# Patient Record
Sex: Female | Born: 1970 | Race: Black or African American | Hispanic: No | Smoking: Never smoker
Health system: Southern US, Community
[De-identification: ages and names within clinical notes are randomized; demographics above are authoritative.]

## PROBLEM LIST (undated history)

## (undated) DIAGNOSIS — Z789 Other specified health status: Secondary | ICD-10-CM

## (undated) DIAGNOSIS — R87629 Unspecified abnormal cytological findings in specimens from vagina: Secondary | ICD-10-CM

## (undated) HISTORY — PX: GALLBLADDER SURGERY: SHX652

## (undated) HISTORY — DX: Other specified health status: Z78.9

## (undated) HISTORY — DX: Unspecified abnormal cytological findings in specimens from vagina: R87.629

---

## 2001-05-31 ENCOUNTER — Inpatient Hospital Stay (HOSPITAL_COMMUNITY): Admission: AD | Admit: 2001-05-31 | Discharge: 2001-06-02 | Payer: Self-pay | Admitting: Obstetrics and Gynecology

## 2004-05-01 ENCOUNTER — Emergency Department (HOSPITAL_COMMUNITY): Admission: EM | Admit: 2004-05-01 | Discharge: 2004-05-01 | Payer: Self-pay | Admitting: Emergency Medicine

## 2004-09-20 ENCOUNTER — Observation Stay (HOSPITAL_COMMUNITY): Admission: RE | Admit: 2004-09-20 | Discharge: 2004-09-21 | Payer: Self-pay | Admitting: Obstetrics and Gynecology

## 2004-11-05 ENCOUNTER — Inpatient Hospital Stay (HOSPITAL_COMMUNITY): Admission: RE | Admit: 2004-11-05 | Discharge: 2004-11-07 | Payer: Self-pay | Admitting: Obstetrics and Gynecology

## 2005-05-19 IMAGING — US US OB COMP +14 WK
1 series · 13 of 28 positions shown · non-contrast
Comparison: none

CLINICAL DATA: No prenatal care; advanced gestational age

[Series 1: unknown · 13 of 41 slices shown]
[im 2/41]
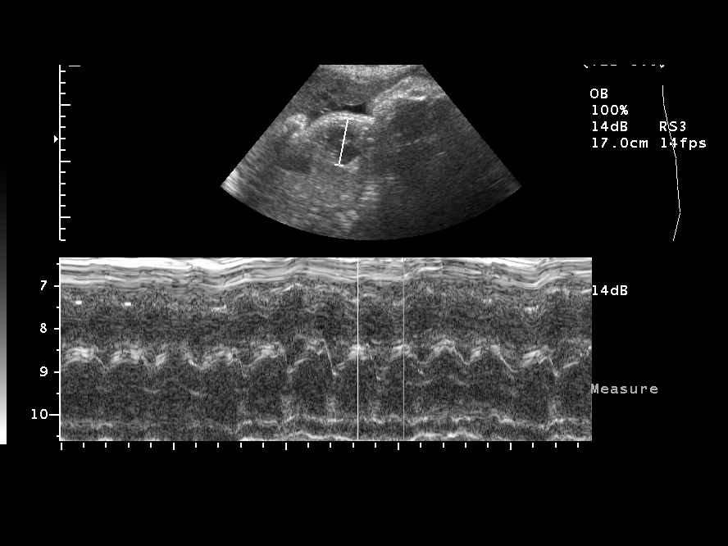
[im 5/41]
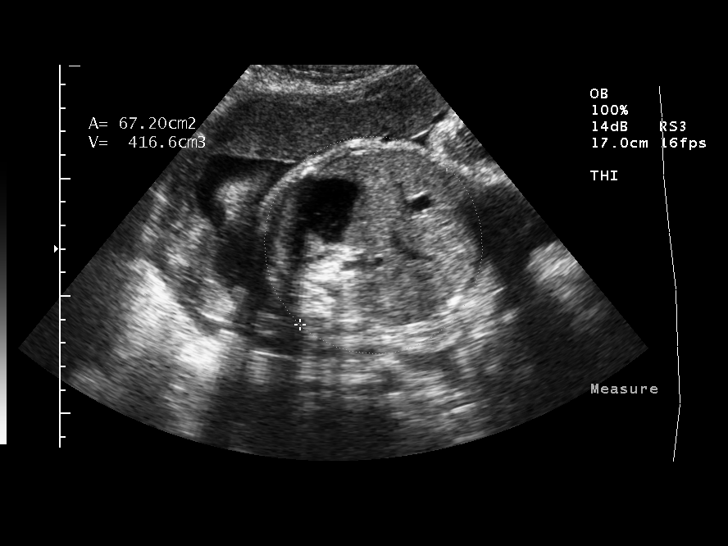
[im 8/41]
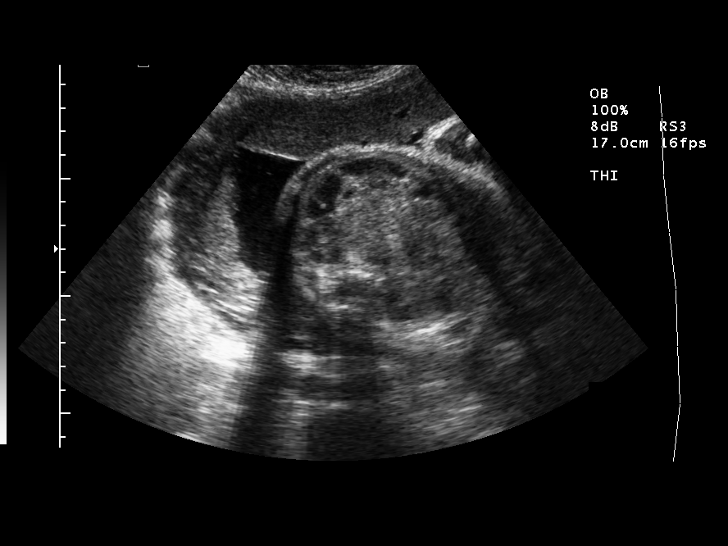
[im 11/41]
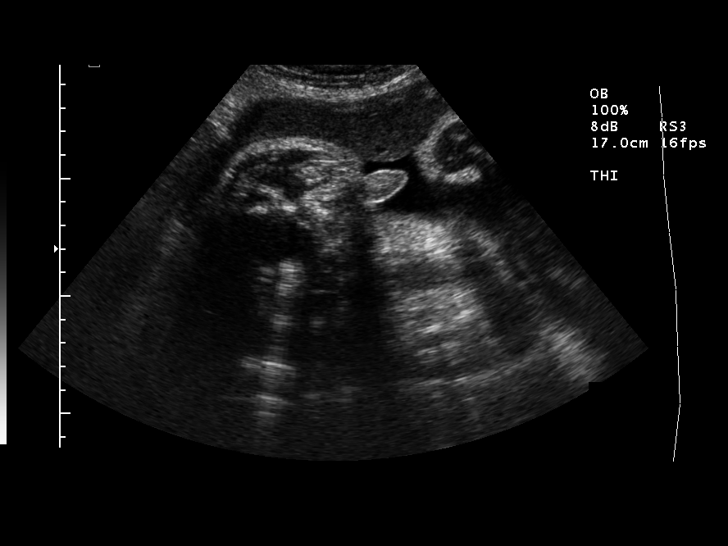
[im 14/41]
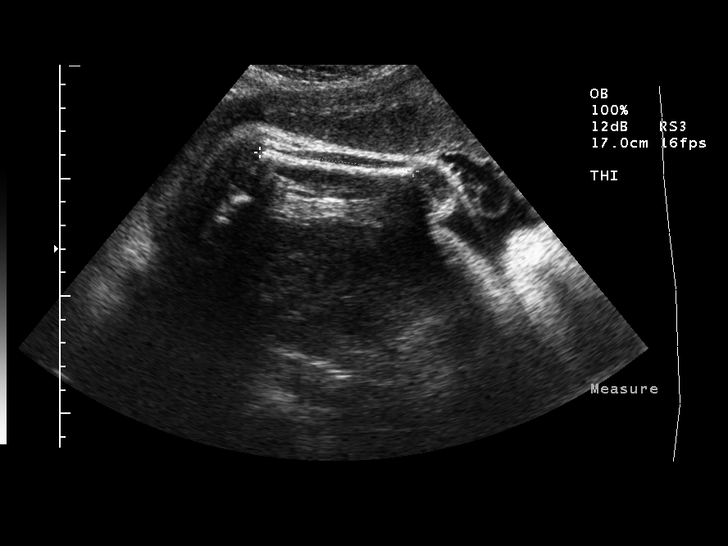
[im 17/41]
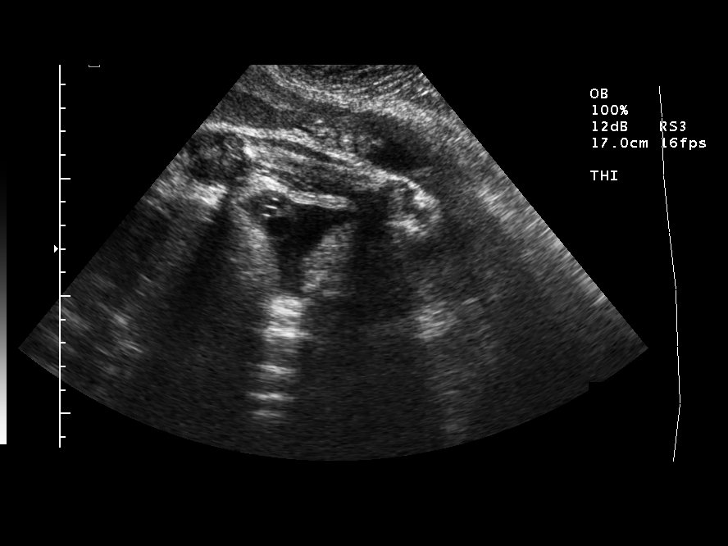
[im 21/41]
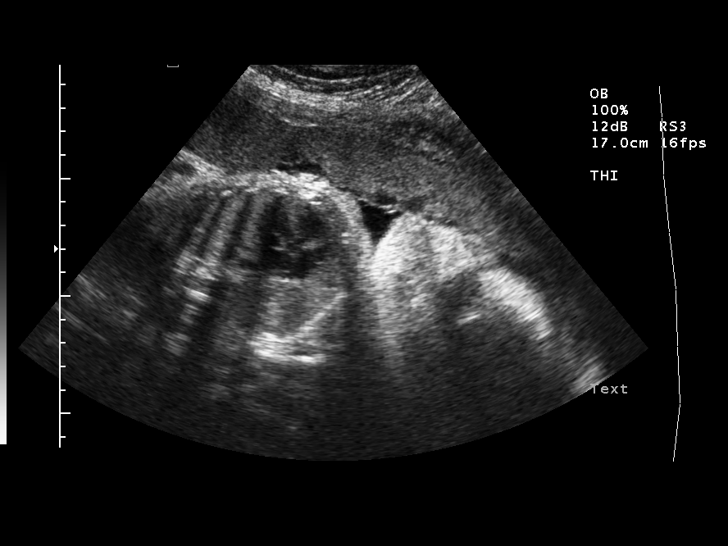
[im 24/41]
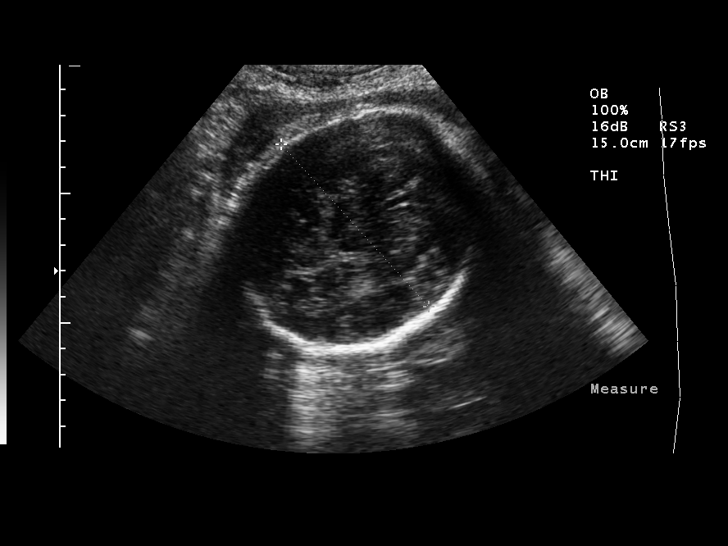
[im 27/41]
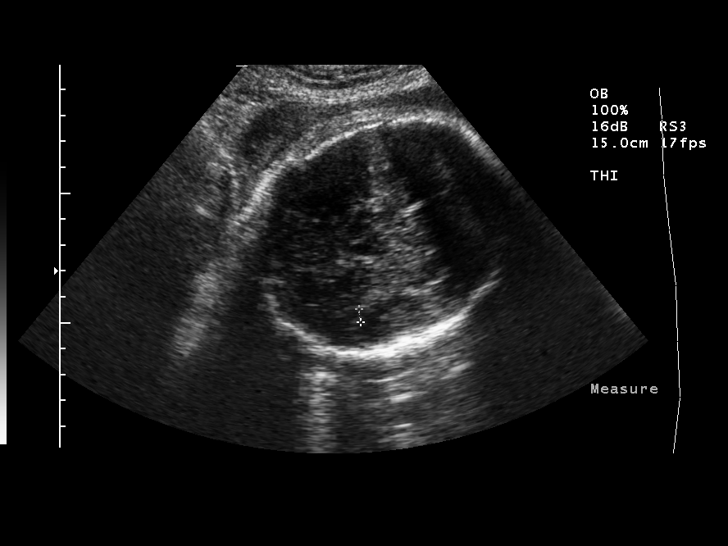
[im 30/41]
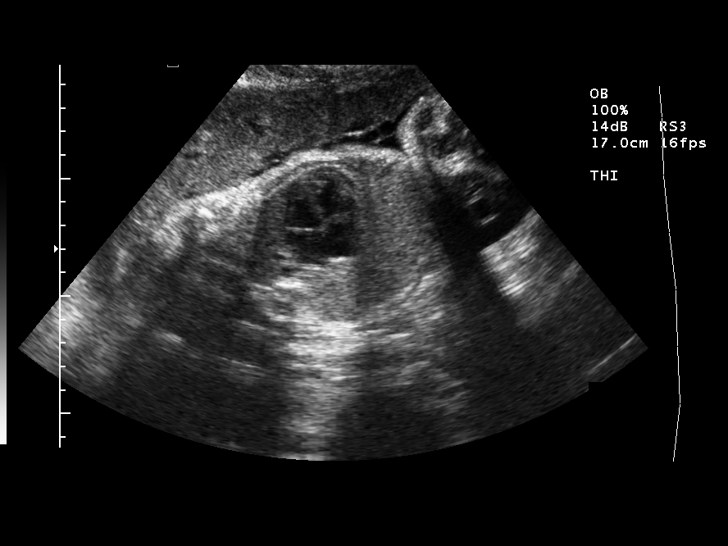
[im 33/41]
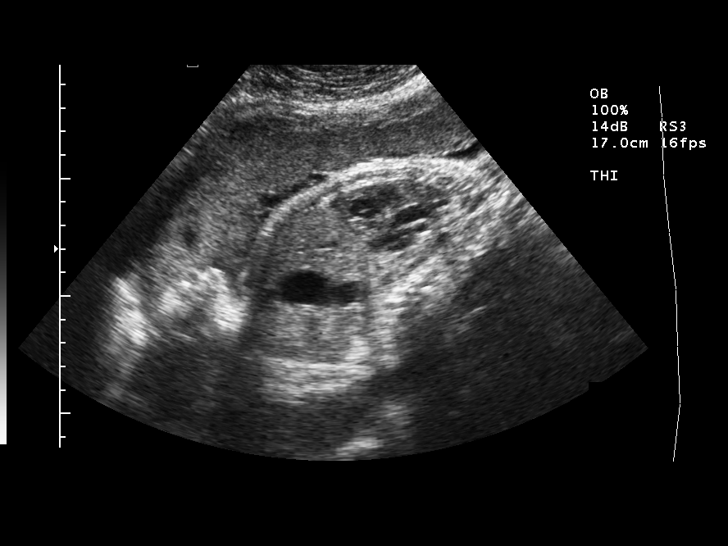
[im 36/41]
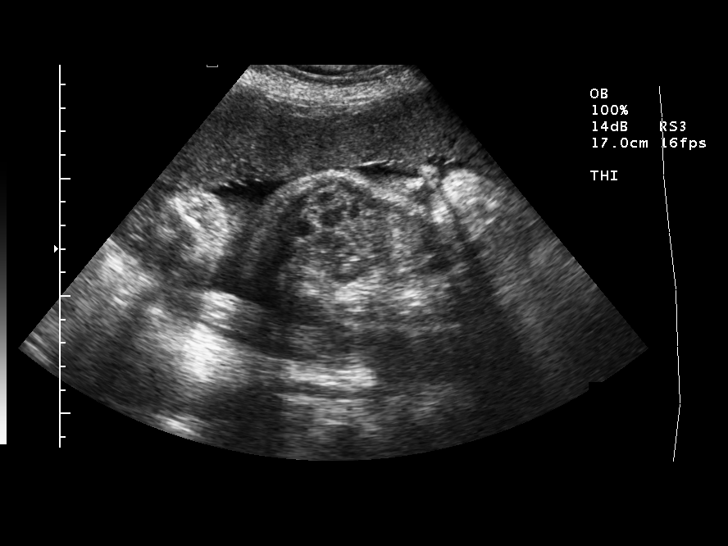
[im 39/41]
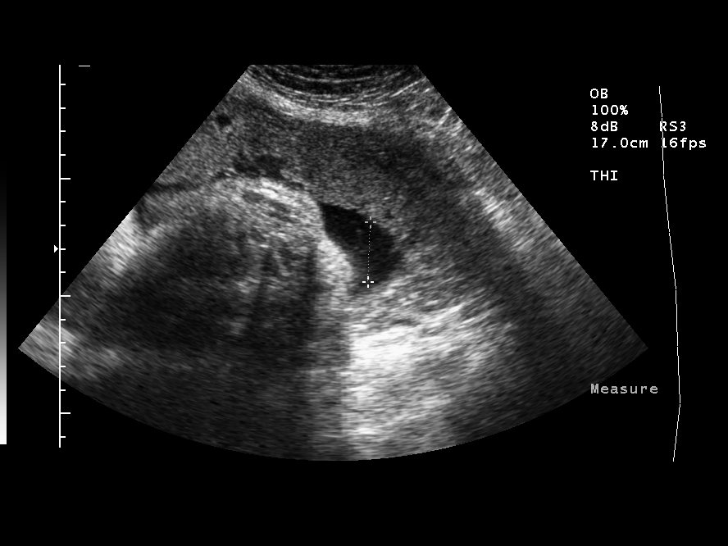

[13 of 28 positions shown; findings below may reference images not displayed]

OBSTETRICAL ULTRASOUND:
 Number of Fetuses:  Single
 Heart Rate:  147 beats per minute 
 Movement:  Yes
 Breathing:  Yes  
 Presentation:  Cephalic
 Placental Location:  Anterior
 Previa:  No
 Amniotic Fluid (Subjective):  Normal
 Amniotic Fluid (Objective):   11.8 cm (AFI) for 34 weeks

 FETAL BIOMETRY
 BPD:   8.4 cm, 34 weeks 0 days
 HC:   30.4 cm, 33 weeks 6 days
 AC:  29.1 cm, 33 weeks 1 day
 FL:  6.5 cm, 34 weeks 0 days

 MEAN GA:  33 weeks 5 days

 EFW:  3343 grams 

 FETAL ANATOMY
 Lateral Ventricles:  Visualized 
 Thalami/CSP:    Visualized 
 Posterior Fossa:  Not visualized 
 Nuchal Region:  N/A
 Spine:    Not visualized   
 4 Chamber Heart on Left:    Visualized 
 Stomach on Left:    Visualized 
 3 Vessel Cord:  Visualized 
 Cord Insertion site:  Not visualized 
 Kidneys:  Visualized 
 Bladder:  Visualized 
 Extremities:    Left and right lower

 ADDITIONAL ANATOMY VISUALIZED:  Male genitalia

 Evaluation limited by:  Fetal position and advanced gestational age. 

 MATERNAL UTERINE AND ADNEXAL FINDINGS
 Cervix: 3.1 cm
IMPRESSION: 1.  Single live intrauterine gestation in cephalic presentation.  Anterior placenta without previa.
 2.  No morphologic abnormalities, though evaluation of morphology is limited by advanced gestational age.

 </u12:p>

## 2010-03-23 ENCOUNTER — Emergency Department (HOSPITAL_COMMUNITY): Admission: EM | Admit: 2010-03-23 | Discharge: 2010-03-23 | Payer: Self-pay | Admitting: Emergency Medicine

## 2010-09-28 ENCOUNTER — Emergency Department (HOSPITAL_COMMUNITY)
Admission: EM | Admit: 2010-09-28 | Discharge: 2010-09-28 | Payer: Self-pay | Source: Home / Self Care | Admitting: Emergency Medicine

## 2010-11-21 LAB — URINALYSIS, ROUTINE W REFLEX MICROSCOPIC
Glucose, UA: NEGATIVE mg/dL
Ketones, ur: NEGATIVE mg/dL
Protein, ur: NEGATIVE mg/dL

## 2010-11-21 LAB — URINE CULTURE
Colony Count: NO GROWTH
Culture: NO GROWTH

## 2010-11-21 LAB — URINE MICROSCOPIC-ADD ON

## 2011-01-22 NOTE — Discharge Summary (Signed)
[facility]  Patient:    Lauren Mata, Lauren Mata Visit Number: [id number] MRN: [id number]          Service Type: Attending:  Christin Bach, M.D. Dictated by:   Christin Bach, M.D. Adm. Date:  05/31/01 Disc. Date: 06/02/01                             Discharge Summary  ADMITTING DIAGNOSES:  Pregnancy term, active labor, unregistered pregnancy without prenatal care.  DISCHARGE DIAGNOSES:  Pregnancy term, delivered, unregistered, no prenatal care.  PROCEDURE:  Obstetrical ultrasound by Christin Bach, M.D. May 31, 2001, vacuum assisted vaginal delivery 72.79 by Zerita Boers, C.N.M.  DISCHARGE MEDICATIONS: 1. Darvocet-N 100 one q.4h. p.r.n. pain. 2. Motrin 800 mg one q.8h. p.r.n. minor discomfort.  FOLLOW-UP:  Four weeks our office for postpartum tubal ligation.  HISTORY OF PRESENT ILLNESS:  This 40 year old female gravida 5, para 4 is admitted after pregnancy for which she sought prenatal care.  She presented on the evening of May 31, 2001 complaining of contractions that p.m. Prenatal course was completely unknown to physicians.  She knew she was pregnant and chose not to seek care.  She did not take iron and vitamins.  She presented on the evening with active labor.  Initial assessment showed the cervix to be 6 cm, 100%, -1 with vertex presentation suspected.  Group B Strep culture was performed.  Ultrasound performed revealing the infant to be a term sized infant, vertex presentation with biparietal diameter 93 mm.  No evidence of oligohydramnios.  Amniotomy was performed.  The patient then progressed over the next hour to completely dilated, delivered by vacuum assisted vaginal delivery due to fetal bradycardia.  Infant was an 8 pound 0.6 ounce female infant with Apgars 9/9.  Amniotic fluid was clear without malodor.  She did receive a single dose of antibiotics, ampicillin 2 g IV due to unknown group B Strep status.  Postpartum course was  uneventful.  The patient was stable for discharge home on June 02, 2001.  FOLLOW-UP:  Four weeks for tubal ligation. Dictated by:   Christin Bach, M.D. Attending:  Christin Bach, M.D. DD:  06/11/01 TD:  06/12/01 Job: [id number]

## 2011-01-22 NOTE — H&P (Signed)
NAMEMARYJAYNE, KLEVEN              ACCOUNT NO.:  [id number]   MEDICAL RECORD NO.:  [id number]          PATIENT TYPE:  INP   LOCATION:  LDR5                          FACILITY:  [facility]   PHYSICIAN:  Tilda Burrow, M.D. DATE OF BIRTH:  1971-05-26   DATE OF ADMISSION:  11/05/2004  DATE OF DISCHARGE:  LH                                HISTORY & PHYSICAL   CHIEF COMPLAINT:  Labor.   HISTORY OF PRESENT ILLNESS:  Welma is a 40 year old gravida 8, para 5 with  an EDC of March 1 based on a 33-week ultrasound. She historically has very  little prenatal care, and in fact, did not have any prenatal care for this  baby except for a visit to labor and delivery back in January for some  preterm contractions without cervical change. At that time, we did draw some  prenatal labs on her and did an ultrasound. Her fundal height is  appropriate. Looking through her record at the office, there are records of  six spontaneous vaginal deliveries, although in the emergency room she said  she only had four children, and then today she says she only had five, so I  am not sure where this is going, but we do have a record of six vaginal  deliveries. So this will be her seventh delivery.   PRENATAL LABORATORY DATA:  Blood type A positive, rubella is immune. HBSAG,  HIV, and RPR are all negative.   PAST MEDICAL HISTORY:  Negative.   PAST SURGICAL HISTORY:  Positive for gallbladder surgery.   OBSTETRICAL HISTORY:  Little prenatal care as noted before, but the  deliveries were uncomplicated. Prenatal course has been complicated by no  prenatal care.   PHYSICAL EXAMINATION:  HEENT:  Within normal limits.  LUNGS:  Clear to auscultation.  HEART:  Regular rate and rhythm.  ABDOMEN:  Soft and nontender. Fundal height is appropriate for term, having  mild to moderate contractions every five to six minutes. Cervix is 5, 100%,  -2 station. Artificial rupture of membranes reveals clear fluid. Fetal heart  rate is  reactive without decelerations.  EXTREMITIES:  Legs are negative.   IMPRESSION:  Intrauterine pregnancy at 40 weeks based on a mid third  trimester ultrasound, active labor, reassuring maternal/fetal status.   PLAN:  We will have expectant management except for prophylaxis treatment of  group B strep since that is unknown.      FC/MEDQ  D:  11/05/2004  T:  11/05/2004  Job:  [id number]

## 2011-01-22 NOTE — Discharge Summary (Signed)
Lauren Mata, HESLER              ACCOUNT NO.:  [id number]   MEDICAL RECORD NO.:  [id number]          PATIENT TYPE:  OIB   LOCATION:  A415                          FACILITY:  [facility]   PHYSICIAN:  Lazaro Arms, M.D.   DATE OF BIRTH:  02-Oct-1970   DATE OF ADMISSION:  09/20/2004  DATE OF DISCHARGE:  01/15/2006LH                                 DISCHARGE SUMMARY   REASON FOR ADMISSION:  Premature uterine contractions.   PAST MEDICAL HISTORY:  Negative.   PAST SURGICAL HISTORY:  Negative.   ALLERGIES:  No known drug allergies.   PRENATAL COURSE:  The patient has had no prenatal care uneventful, per  patient, up until this time.  She presents now with a 31 cm fundal height  and premature uterine contractions.   PHYSICAL EXAMINATION:  VITAL SIGNS:  Normal.  Contractions have not arrested  on p.o. Brethine.  ABDOMEN:  Fundal height is 31 cm.  Fetal heart tones strong and regular  without decelerations.   PLAN:  We are going to get an OB ultrasound, OB prenatal profile and  discharge the patient on p.o. Brethine 5 mg q.4h.  She is to be seen in the  office on Wednesday.      DL/MEDQ  D:  04/54/0981  T:  09/21/2004  Job:  [id number]   cc:   [facility]

## 2011-01-22 NOTE — Op Note (Signed)
Lauren Mata, Lauren Mata              ACCOUNT NO.:  [id number]   MEDICAL RECORD NO.:  [id number]          PATIENT TYPE:  INP   LOCATION:  LDR5                          FACILITY:  [facility]   PHYSICIAN:  Lazaro Arms, M.D.   DATE OF BIRTH:  07/02/1971   DATE OF PROCEDURE:  11/05/2004  DATE OF DISCHARGE:                                 OPERATIVE REPORT   DELIVERY NOTE:  Ozzie progressed quickly through labor and was noted to be  10 cm dilated at approximately 11:15.  After a brief second stage, she  delivered a viable female infant at 11:27.  Apgars were 9 and 9, weight 7  pounds 5 ounces.  He does appear to be a full-term baby.  Twenty units of  Pitocin dilated in 1000 mL of lactated Ringer's was infused rapidly IV.  The  placenta separated spontaneously and delivered via controlled cord traction  at 11:37.  It was inspected and appears to be intact with a three-vessel  cord.  Blood loss is minimal.  The vagina was inspected and no lacerations  were noted.  The patient tolerated delivery well.  She does express an  interest to get her tubes tied.  Due to no prenatal care, will have to get  the Medicaid permission slip form signed in the office, and she plans to be  abstinent for the next four weeks.      FC/MEDQ  D:  11/05/2004  T:  11/05/2004  Job:  [id number]

## 2011-05-26 IMAGING — CR DG CHEST 2V
2 series · 2 of 2 positions shown · non-contrast
Comparison: None

CLINICAL DATA: Cough.  Chest congestion.  Short of breath.

CHEST - 2 VIEW

[view not recorded (1 of 2)]
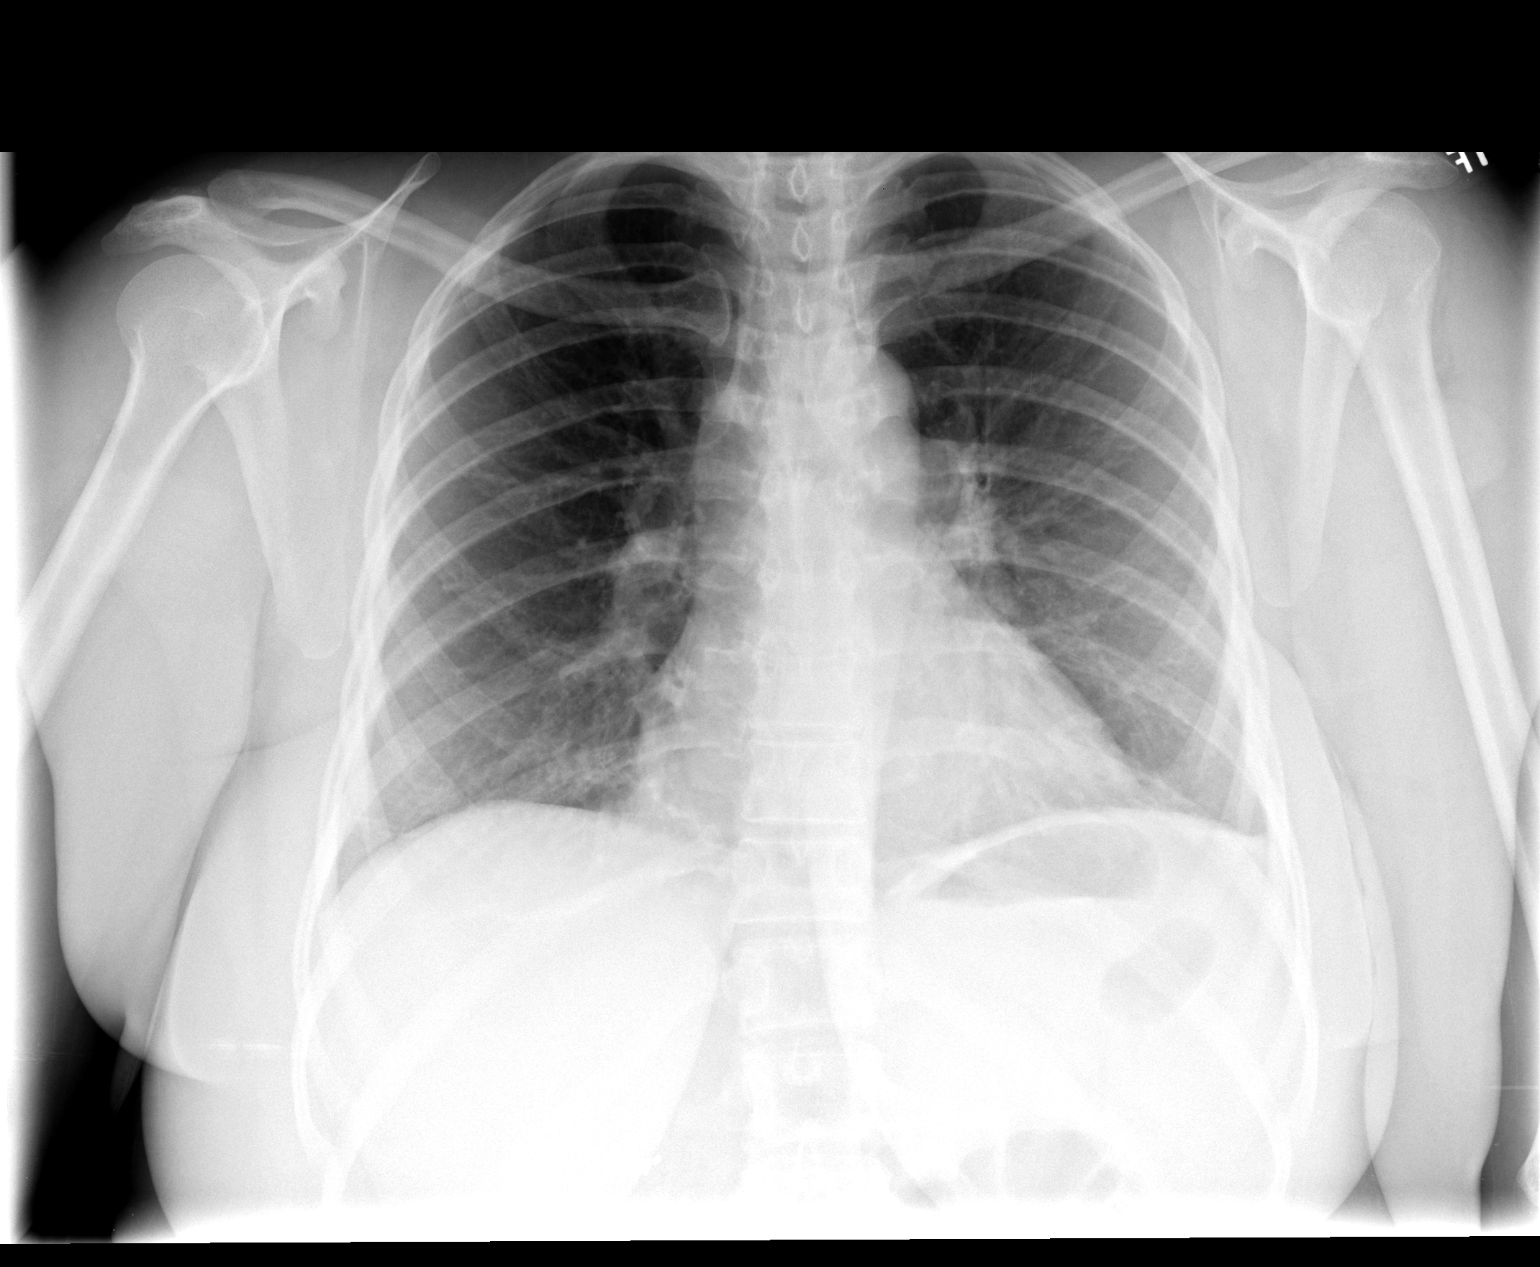

[view not recorded (2 of 2)]
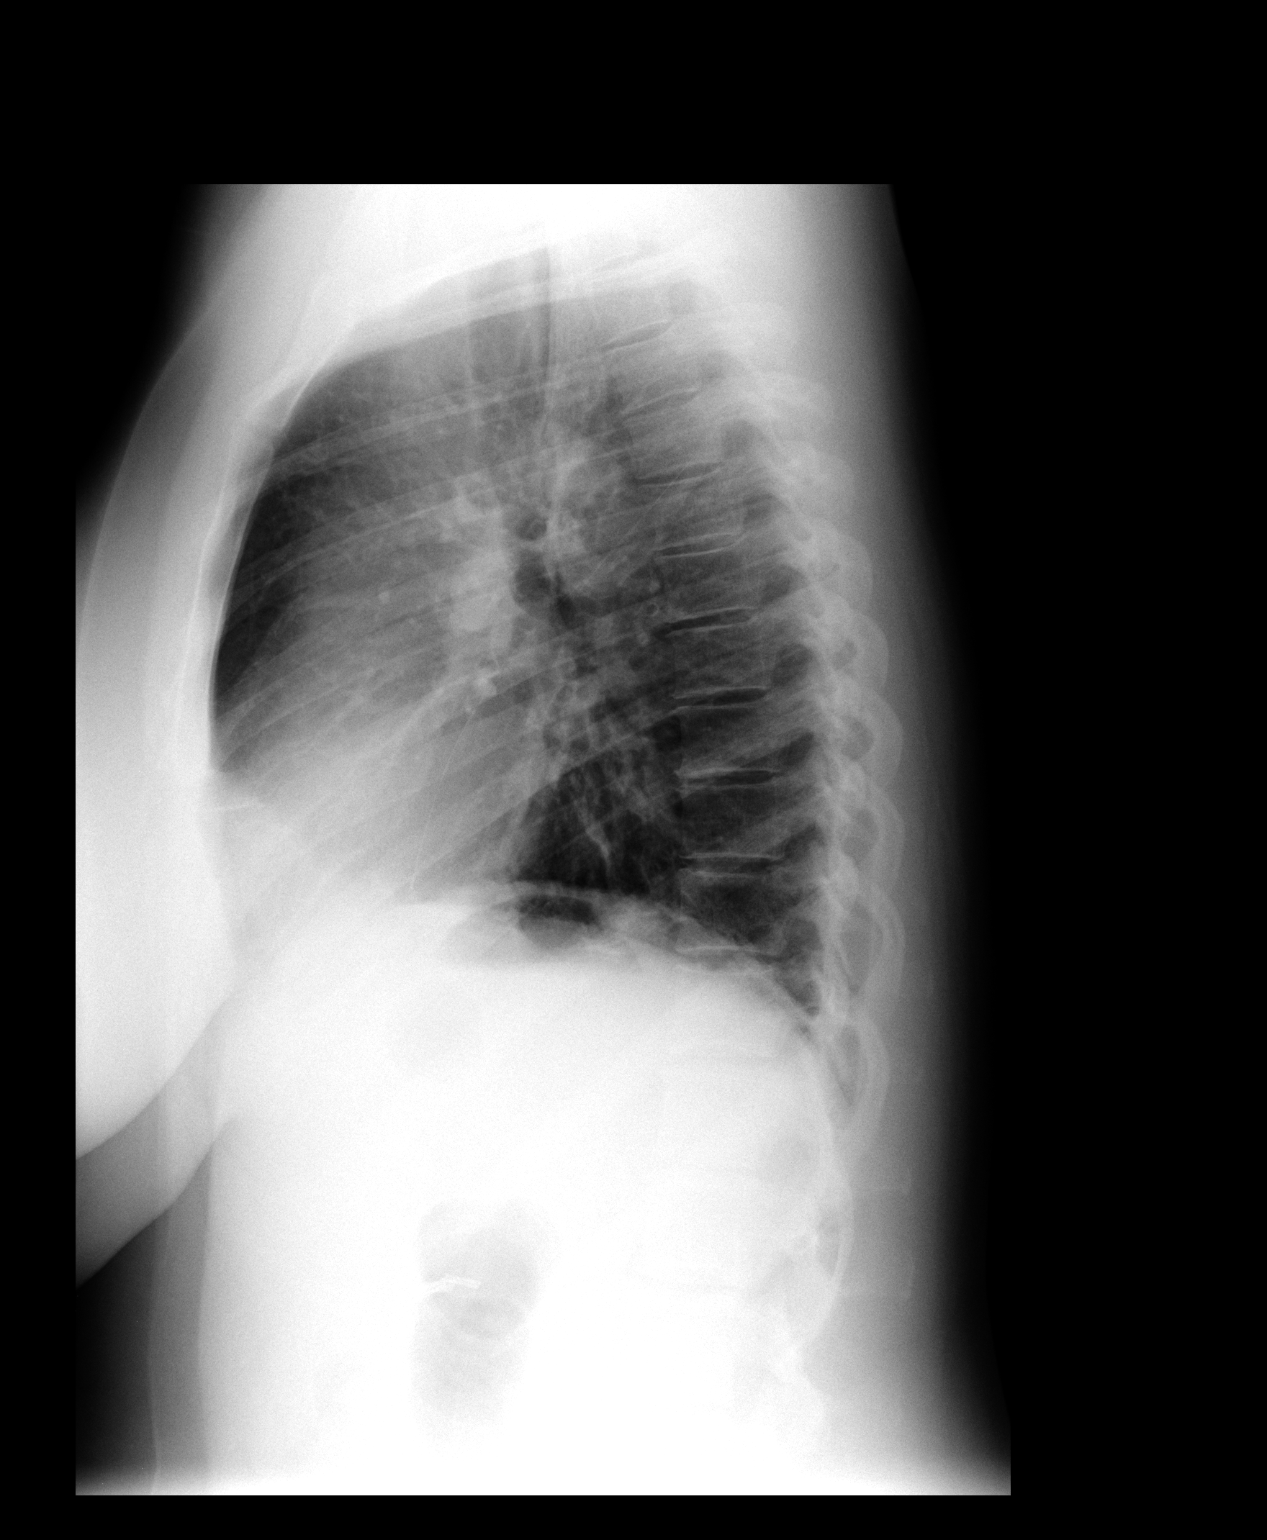

[2 of 2 positions shown; findings below may reference images not displayed]

FINDINGS: Heart size is normal.  Mediastinal shadows are normal.
There is mild bronchial thickening but there is no infiltrate,
collapse or effusion.  No bony abnormality.
IMPRESSION: Bronchitis.  No consolidation or collapse.

## 2013-01-18 ENCOUNTER — Encounter: Payer: Self-pay | Admitting: *Deleted

## 2013-01-26 ENCOUNTER — Encounter: Payer: Self-pay | Admitting: *Deleted

## 2013-01-30 ENCOUNTER — Encounter: Payer: Self-pay | Admitting: *Deleted

## 2013-01-30 ENCOUNTER — Ambulatory Visit: Payer: Self-pay | Admitting: Obstetrics & Gynecology

## 2013-05-17 ENCOUNTER — Encounter: Payer: Medicaid Other | Admitting: Obstetrics & Gynecology

## 2013-05-31 ENCOUNTER — Encounter: Payer: Self-pay | Admitting: Obstetrics & Gynecology

## 2013-05-31 ENCOUNTER — Ambulatory Visit (INDEPENDENT_AMBULATORY_CARE_PROVIDER_SITE_OTHER): Payer: Medicaid Other | Admitting: Obstetrics & Gynecology

## 2013-05-31 VITALS — BP 100/80 | Ht 60.0 in | Wt 180.0 lb

## 2013-05-31 DIAGNOSIS — B081 Molluscum contagiosum: Secondary | ICD-10-CM

## 2013-05-31 NOTE — Progress Notes (Signed)
Patient ID: Lauren Mata, female   DOB: [birth year], 42 y.o.   MRN: [id number] [name] is it she symptomatic external vulvar lesion for the past 6 months or so Ties discharge He today she's been fully worked up for STDs all of which was negative  On exam she has molluscum contagiosum Vagina is no discharge no lesions her IUD is visible  I recommend she used topical T. tree oil I'll see her back in one month for followup that's not successful there some other products we can use

## 2013-06-28 ENCOUNTER — Ambulatory Visit: Payer: Medicaid Other | Admitting: Obstetrics & Gynecology

## 2014-07-08 ENCOUNTER — Encounter: Payer: Self-pay | Admitting: Obstetrics & Gynecology

## 2014-12-17 ENCOUNTER — Ambulatory Visit (INDEPENDENT_AMBULATORY_CARE_PROVIDER_SITE_OTHER): Payer: Medicaid Other | Admitting: Advanced Practice Midwife

## 2014-12-17 ENCOUNTER — Encounter: Payer: Self-pay | Admitting: Advanced Practice Midwife

## 2014-12-17 VITALS — BP 110/70 | HR 80 | Ht 60.0 in | Wt 188.6 lb

## 2014-12-17 DIAGNOSIS — Z30433 Encounter for removal and reinsertion of intrauterine contraceptive device: Secondary | ICD-10-CM

## 2014-12-17 DIAGNOSIS — Z3202 Encounter for pregnancy test, result negative: Secondary | ICD-10-CM

## 2014-12-17 LAB — POCT URINE PREGNANCY: Preg Test, Ur: NEGATIVE

## 2014-12-17 NOTE — Progress Notes (Signed)
Lauren Mata is a 44 y.o. year old African American female who presents for removal and replacement of her IUD. The new IUD is Lyletta. It has been 5 years since her previous IUD placement.  The risks and benefits of the method and placement have been thouroughly reviewed with the patient and all questions were answered.  Specifically the patient is aware of failure rate of 09/998, expulsion of the IUD and of possible perforation.  The patient is aware of irregular bleeding due to the method and understands the incidence of irregular bleeding diminishes with time.  Time out was performed.  A Seigler speculum was placed.  The cervix was prepped using Betadine. The strings were found to be  visible.   They were grasped and the Mirena was easily removed. The cervix was then grasped with a tenaculum and the uterus was sounded to 7 cm. The IUD was inserted to 7 cm.  It was pulled back 1 cm and the IUD was disengaged. After 15 seconds, the IUD was placed in the fundus.  The strings were trimmed to 3 cm.  Sonogram was performed and the proper placement of the IUD was verified.  The patient was instructed on signs and symptoms of infection and to check for the strings after each menses or each month.  The patient is to refrain from intercourse for 3 days.

## 2015-01-14 ENCOUNTER — Encounter: Payer: Self-pay | Admitting: *Deleted

## 2015-01-14 ENCOUNTER — Ambulatory Visit: Payer: Medicaid Other | Admitting: Advanced Practice Midwife

## 2017-03-05 ENCOUNTER — Emergency Department
Admission: EM | Admit: 2017-03-05 | Discharge: 2017-03-05 | Disposition: A | Discharge status: Home / Self Care | Payer: Self-pay | Attending: Emergency Medicine | Admitting: Emergency Medicine

## 2017-03-05 DIAGNOSIS — Y999 Unspecified external cause status: Secondary | ICD-10-CM | POA: Insufficient documentation

## 2017-03-05 DIAGNOSIS — S0101XA Laceration without foreign body of scalp, initial encounter: Secondary | ICD-10-CM | POA: Insufficient documentation

## 2017-03-05 DIAGNOSIS — Y29XXXA Contact with blunt object, undetermined intent, initial encounter: Secondary | ICD-10-CM | POA: Insufficient documentation

## 2017-03-05 DIAGNOSIS — Y929 Unspecified place or not applicable: Secondary | ICD-10-CM | POA: Insufficient documentation

## 2017-03-05 DIAGNOSIS — Y939 Activity, unspecified: Secondary | ICD-10-CM | POA: Insufficient documentation

## 2017-03-05 DIAGNOSIS — Z23 Encounter for immunization: Secondary | ICD-10-CM | POA: Insufficient documentation

## 2017-03-05 MED ORDER — TETANUS-DIPHTH-ACELL PERTUSSIS 5-2.5-18.5 LF-MCG/0.5 IM SUSP
0.5000 mL | Freq: Once | INTRAMUSCULAR | Status: AC
  Administered 2017-03-05: 0.5 mL via INTRAMUSCULAR
  Filled 2017-03-05: qty 0.5

## 2017-03-05 MED ORDER — LIDOCAINE HCL (PF) 1 % IJ SOLN
10.0000 mL | Freq: Once | INTRAMUSCULAR | Status: AC
  Administered 2017-03-05: 10 mL
  Filled 2017-03-05: qty 10

## 2017-03-05 MED ORDER — AMOXICILLIN-POT CLAVULANATE 875-125 MG PO TABS: 1.0000 | ORAL_TABLET | Freq: Two times a day (BID) | ORAL | 0 refills | Status: DC

## 2017-03-05 NOTE — ED Provider Notes (Signed)
[facility] Emergency Department Provider Note  ____________________________________________  Time seen: Approximately 8:49 PM  I have reviewed the triage vital signs and the nursing notes.   HISTORY  Chief Complaint Head Laceration    HPI Lauren Mata is a 46 y.o. female who presents emergency department complaining of a laceration to her scalp. Patient states that she leaned over and hit her head against the corner of A countertop. Patient was able control bleeding with direct pressure. Patient denies any loss of consciousness. No headache. No other injury or complaint. No medications prior to arrival. Patient is unsure of her last tetanus shot.   Past Medical History:  Diagnosis Date  . Medical history non-contributory     Patient Active Problem List   Diagnosis Date Noted  . Encounter for IUD removal and reinsertion 12/17/2014  . Molluscum contagiosum 05/31/2013    Past Surgical History:  Procedure Laterality Date  . GALLBLADDER SURGERY      Prior to Admission medications   Medication Sig Start Date End Date Taking? Authorizing Provider  amoxicillin-clavulanate (AUGMENTIN) 875-125 MG tablet Take 1 tablet by mouth 2 (two) times daily. 03/05/17   Amy Belloso, Delorise Royals, PA-C  levonorgestrel (MIRENA) 20 MCG/24HR IUD 1 each by Intrauterine route once.    [provider]    Allergies Patient has no known allergies.  No family history on file.  Social History Social History  Substance Use Topics  . Smoking status: Never Smoker  . Smokeless tobacco: Never Used  . Alcohol use No     Review of Systems  Constitutional: No fever/chills Eyes: No visual changes. No discharge ENT: No upper respiratory complaints. Cardiovascular: no chest pain. Respiratory: no cough. No SOB. Gastrointestinal: No abdominal pain.  No nausea, no vomiting.  Musculoskeletal: Negative for musculoskeletal pain. Skin: Negative for rash, abrasions,  lacerations, ecchymosis.Positive for scalp laceration. Neurological: Negative for headaches, focal weakness or numbness. 10-point ROS otherwise negative.  ____________________________________________   PHYSICAL EXAM:  VITAL SIGNS: ED Triage Vitals [03/05/17 2002]  Enc Vitals Group     BP 129/80     Pulse Rate 93     Resp 16     Temp 98.6 F (37 C)     Temp Source Oral     SpO2 100 %     Weight 180 lb (81.6 kg)     Height 5' (1.524 m)     Head Circumference      Peak Flow      Pain Score 3     Pain Loc      Pain Edu?      Excl. in GC?      Constitutional: Alert and oriented. Well appearing and in no acute distress. Eyes: Conjunctivae are normal. PERRL. EOMI. Head: 5 cm laceration noted to the right frontal scalp. This is just superior to the hairline. No bleeding at this time. No foreign body. Edges are smooth the nature. Edges are gaped open. Patient is nontender to palpation of the osseous structures of the skull or face. No battle signs. No raccoon eyes. No serosanguineous fluid drainage from the ears or nares. ENT:      Ears:       Nose: No congestion/rhinnorhea.      Mouth/Throat: Mucous membranes are moist.  Neck: No stridor.  No cervical spine tenderness to palpation.  Cardiovascular: Normal rate, regular rhythm. Normal S1 and S2.  Good peripheral circulation. Respiratory: Normal respiratory effort without tachypnea or retractions. Lungs CTAB. Good air  entry to the bases with no decreased or absent breath sounds. Musculoskeletal: Full range of motion to all extremities. No gross deformities appreciated. Neurologic:  Normal speech and language. No gross focal neurologic deficits are appreciated.  Skin:  Skin is warm, dry and intact. No rash noted. Psychiatric: Mood and affect are normal. Speech and behavior are normal. Patient exhibits appropriate insight and judgement.   ____________________________________________   LABS (all labs ordered are listed, but only  abnormal results are displayed)  Labs Reviewed - No data to display ____________________________________________  EKG   ____________________________________________  RADIOLOGY   No results found.  ____________________________________________    PROCEDURES  Procedure(s) performed:    Marland Kitchen.Marland Kitchen.Laceration Repair Date/Time: 03/05/2017 9:31 PM Performed by: Gala RomneyUTHRIELL, Zaim Nitta D Authorized by: Gala RomneyUTHRIELL, Isabeau Mccalla D   Consent:    Consent obtained:  Verbal   Consent given by:  Patient   Risks discussed:  Pain and poor cosmetic result Anesthesia (see MAR for exact dosages):    Anesthesia method:  Local infiltration   Local anesthetic:  Lidocaine 1% w/o epi Laceration details:    Location:  Scalp   Scalp location:  Frontal   Length (cm):  5 Repair type:    Repair type:  Simple Exploration:    Hemostasis achieved with:  Direct pressure   Wound exploration: wound explored through full range of motion and entire depth of wound probed and visualized     Wound extent: no foreign bodies/material noted     Contaminated: no   Treatment:    Area cleansed with:  Betadine   Amount of cleaning:  Extensive   Irrigation solution:  Sterile saline   Irrigation method:  Syringe Skin repair:    Repair method:  Staples   Number of staples:  8 Approximation:    Approximation:  Close Post-procedure details:    Dressing:  Open (no dressing)   Patient tolerance of procedure:  Tolerated well, no immediate complications      Medications  Tdap (BOOSTRIX) injection 0.5 mL (not administered)  lidocaine (PF) (XYLOCAINE) 1 % injection 10 mL (not administered)     ____________________________________________   INITIAL IMPRESSION / ASSESSMENT AND PLAN / ED COURSE  Pertinent labs & imaging results that were available during my care of the patient were reviewed by me and considered in my medical decision making (see chart for details).  Review of the [place] CSRS was performed in accordance of  the [facility] prior to dispensing any controlled drugs.     Patient's diagnosis is consistent with scalp laceration.This is closed as described above. Patient will be discharged home with prescriptions for antibiotics prophylactically. Patient is to follow up with primary care in 1 week for staple removal or sooner as needed or otherwise directed. Patient is given ED precautions to return to the ED for any worsening or new symptoms.     ____________________________________________  FINAL CLINICAL IMPRESSION(S) / ED DIAGNOSES  Final diagnoses:  Laceration of scalp, initial encounter      NEW MEDICATIONS STARTED DURING THIS VISIT:  New Prescriptions   AMOXICILLIN-CLAVULANATE (AUGMENTIN) 875-125 MG TABLET    Take 1 tablet by mouth 2 (two) times daily.        This chart was dictated using voice recognition software/Dragon. Despite best efforts to proofread, errors can occur which can change the meaning. Any change was purely unintentional.    Racheal PatchesCuthriell, Keeshia Sanderlin D, PA-C 03/05/17 2135    Phineas SemenGoodman, Graydon, MD 03/05/17 [phone]

## 2017-03-05 NOTE — ED Triage Notes (Signed)
Pt with forehead laceration to hairline. Pt states she was cleaning when she struck her forehead on a sink. Dressing in place. Pt denies loc.

## 2017-03-12 ENCOUNTER — Emergency Department
Admission: EM | Admit: 2017-03-12 | Discharge: 2017-03-12 | Disposition: A | Discharge status: Home / Self Care | Payer: Self-pay | Attending: Emergency Medicine | Admitting: Emergency Medicine

## 2017-03-12 ENCOUNTER — Encounter: Payer: Self-pay | Admitting: Emergency Medicine

## 2017-03-12 DIAGNOSIS — Z4802 Encounter for removal of sutures: Secondary | ICD-10-CM | POA: Insufficient documentation

## 2017-03-12 NOTE — Discharge Instructions (Signed)
Continue to monitor wound, return to your PCP or the emergency department if you noted developing signs of infection.

## 2017-03-12 NOTE — ED Notes (Signed)
8 staples removed from pt right side of head.  Pt tolerated well.  No bleeding, no swelling no redness noted, edges approximated well.

## 2017-03-12 NOTE — ED Provider Notes (Signed)
[facility] Emergency Department Provider Note   ____________________________________________   I have reviewed the triage vital signs and the nursing notes.   HISTORY  Chief Complaint Suture / Staple Removal    HPI Lauren Mata is a 46 y.o. female sustained laceration to her scalp 7 days ago with staple closure. Patient is present today for staple removal. No sign of infection or complications noted. Patient denies fever, chills, headache, vision changes, chest pain, chest tightness, shortness of breath, abdominal pain, nausea and vomiting.   Patient denies fever, chills, headache, vision changes, chest pain, chest tightness, shortness of breath, abdominal pain, nausea and vomiting.  Past Medical History:  Diagnosis Date  . Medical history non-contributory     Patient Active Problem List   Diagnosis Date Noted  . Encounter for IUD removal and reinsertion 12/17/2014  . Molluscum contagiosum 05/31/2013    Past Surgical History:  Procedure Laterality Date  . GALLBLADDER SURGERY      Prior to Admission medications   Medication Sig Start Date End Date Taking? Authorizing Provider  amoxicillin-clavulanate (AUGMENTIN) 875-125 MG tablet Take 1 tablet by mouth 2 (two) times daily. 03/05/17   Cuthriell, Delorise Royals, PA-C  levonorgestrel (MIRENA) 20 MCG/24HR IUD 1 each by Intrauterine route once.    [provider]    Allergies Patient has no known allergies.  History reviewed. No pertinent family history.  Social History Social History  Substance Use Topics  . Smoking status: Never Smoker  . Smokeless tobacco: Never Used  . Alcohol use No    Review of Systems Constitutional: Negative for fever/chills Eyes: No visual changes. Cardiovascular: Denies chest pain. Skin:Negative for rash. Healed incision with (8) staples in the scalp.  Neurological: Negative for headaches.  ____________________________________________   PHYSICAL  EXAM:  VITAL SIGNS: ED Triage Vitals  Enc Vitals Group     BP 03/12/17 1432 133/69     Pulse Rate 03/12/17 1432 78     Resp 03/12/17 1432 18     Temp 03/12/17 1432 98.2 F (36.8 C)     Temp Source 03/12/17 1432 Oral     SpO2 03/12/17 1432 97 %     Weight 03/12/17 1432 180 lb (81.6 kg)     Height 03/12/17 1432 5' (1.524 m)     Head Circumference --      Peak Flow --      Pain Score 03/12/17 1431 0     Pain Loc --      Pain Edu? --      Excl. in GC? --     Constitutional: Alert and oriented. Well appearing and in no acute distress.  Head: Normocephalic and atraumatic. Eyes: Conjunctivae are normal. PERRL. Cardiovascular: Normal rate, regular rhythm. Normal distal pulses. Respiratory: Normal respiratory effort.  Neurologic: Normal speech and language.  Skin:  Skin is warm, dry and intact. No rash noted. Healed incision with (8) staples in the scalp. ____________________________________________   LABS (all labs ordered are listed, but only abnormal results are displayed)  Labs Reviewed - No data to display ____________________________________________  EKG none ____________________________________________  RADIOLOGY none ____________________________________________   PROCEDURES  Procedure(s) performed:  SUTURE REMOVAL Performed by:   Consent: Verbal consent obtained. Patient identity confirmed: provided demographic data Time out: Immediately prior to procedure a "time out" was called to verify the correct patient, procedure, equipment, support staff and site/side marked as required.  Location details: scalp  Wound Appearance: clean  Sutures/Staples Removed: (8) staples  Facility:  sutures placed in this facility Patient tolerance: Patient tolerated the procedure well with no immediate complications.  Wound care discussed. Patient advised to keep covered with sunscreen. Patient was advised to return to the ER for symptoms that change or worsen if unable to  schedule an appointment with primary care.    Critical Care performed: no ____________________________________________   INITIAL IMPRESSION / ASSESSMENT AND PLAN / ED COURSE  Pertinent labs & imaging results that were available during my care of the patient were reviewed by me and considered in my medical decision making (see chart for details).  Patient presented for staple removal. See procedure note above. No complications noted. Recommended monitoring scar area for continued healing and return if signs of infection develop. Patient informed of clinical course, understand medical decision-making process, and agree with plan.   ____   FINAL CLINICAL IMPRESSION(S) / ED DIAGNOSES  Final diagnoses:  Encounter for staple removal       NEW MEDICATIONS STARTED DURING THIS VISIT:  Discharge Medication List as of 03/12/2017  3:11 PM       Note:  This document was prepared using Dragon voice recognition software and may include unintentional dictation errors.   Clois ComberLittle, Legend Pecore M, PA-C 03/12/17 1532    Emily FilbertWilliams, Jonathan E, MD 03/19/17 [phone]

## 2017-03-12 NOTE — ED Triage Notes (Signed)
Pt here for Staple removal. [facility] No issues  Denies pain.

## 2017-03-12 NOTE — ED Notes (Signed)

## 2017-03-12 NOTE — ED Notes (Signed)
See triage note.

## 2021-08-24 ENCOUNTER — Other Ambulatory Visit (HOSPITAL_COMMUNITY)
Admission: RE | Admit: 2021-08-24 | Discharge: 2021-08-24 | Disposition: A | Discharge status: Home / Self Care | Payer: BC Managed Care – PPO | Source: Ambulatory Visit | Attending: Women's Health | Admitting: Women's Health

## 2021-08-24 ENCOUNTER — Other Ambulatory Visit: Payer: Self-pay

## 2021-08-24 ENCOUNTER — Encounter: Payer: Self-pay | Admitting: Women's Health

## 2021-08-24 ENCOUNTER — Ambulatory Visit (INDEPENDENT_AMBULATORY_CARE_PROVIDER_SITE_OTHER): Payer: BC Managed Care – PPO | Admitting: Women's Health

## 2021-08-24 VITALS — BP 129/78 | HR 76 | Ht 60.0 in | Wt 203.4 lb

## 2021-08-24 DIAGNOSIS — Z1212 Encounter for screening for malignant neoplasm of rectum: Secondary | ICD-10-CM

## 2021-08-24 DIAGNOSIS — Z01419 Encounter for gynecological examination (general) (routine) without abnormal findings: Secondary | ICD-10-CM | POA: Insufficient documentation

## 2021-08-24 DIAGNOSIS — Z131 Encounter for screening for diabetes mellitus: Secondary | ICD-10-CM

## 2021-08-24 DIAGNOSIS — Z1211 Encounter for screening for malignant neoplasm of colon: Secondary | ICD-10-CM

## 2021-08-24 DIAGNOSIS — Z1231 Encounter for screening mammogram for malignant neoplasm of breast: Secondary | ICD-10-CM

## 2021-08-24 DIAGNOSIS — E039 Hypothyroidism, unspecified: Secondary | ICD-10-CM

## 2021-08-24 DIAGNOSIS — Z1329 Encounter for screening for other suspected endocrine disorder: Secondary | ICD-10-CM | POA: Diagnosis not present

## 2021-08-24 DIAGNOSIS — R399 Unspecified symptoms and signs involving the genitourinary system: Secondary | ICD-10-CM

## 2021-08-24 LAB — POCT URINALYSIS DIPSTICK OB
Glucose, UA: NEGATIVE
Ketones, UA: NEGATIVE
Leukocytes, UA: NEGATIVE
Nitrite, UA: NEGATIVE
POC,PROTEIN,UA: NEGATIVE

## 2021-08-24 NOTE — Progress Notes (Signed)
WELL-WOMAN EXAMINATION Patient name: Lauren Mata MRN [id number]  Date of birth: 1971-06-03 Chief Complaint:   Gynecologic Exam  History of Present Illness:   Lauren Mata is a 50 y.o. [phone] African-American female being seen today for a routine well-woman exam.  Current complaints: burning and itching when voiding x 6wks  PCP: none      does desire labs Patient's last menstrual period was 08/17/2021 (exact date). The current method of family planning is IUD, Liletta placed 12/17/14 Last pap 12/15/12. Results were: ASCUS w/ HRHPV not done. H/O abnormal pap: yes Last mammogram: never. Results were: N/A. Family h/o breast cancer: no Last colonoscopy: never. Results were: N/A. Family h/o colorectal cancer: no  Depression screen [facility] 2/9 08/24/2021  Decreased Interest 0  Down, Depressed, Hopeless 0  PHQ - 2 Score 0  Altered sleeping 0  Tired, decreased energy 2  Change in appetite 0  Feeling bad or failure about yourself  0  Trouble concentrating 0  Moving slowly or fidgety/restless 0  Suicidal thoughts 0  PHQ-9 Score 2     GAD 7 : Generalized Anxiety Score 08/24/2021  Nervous, Anxious, on Edge 0  Control/stop worrying 0  Worry too much - different things 0  Trouble relaxing 0  Restless 0  Easily annoyed or irritable 0  Afraid - awful might happen 0  Total GAD 7 Score 0     Review of Systems:   Pertinent items are noted in HPI Denies any headaches, blurred vision, fatigue, shortness of breath, chest pain, abdominal pain, abnormal vaginal discharge/itching/odor/irritation, problems with periods, bowel movements, urination, or intercourse unless otherwise stated above. Pertinent History Reviewed:  Reviewed past medical,surgical, social and family history.  Reviewed problem list, medications and allergies. Physical Assessment:   Vitals:   08/24/21 1512 08/24/21 1554  BP: 140/75 129/78  Pulse: 82 76  Weight: 203 lb 6.4 oz (92.3 kg)   Height: 5' (1.524 m)    Body mass index is 39.72 kg/m.        Physical Examination:   General appearance - well appearing, and in no distress  Mental status - alert, oriented to person, place, and time  Psych:  She has a normal mood and affect  Skin - warm and dry, normal color, no suspicious lesions noted  Chest - effort normal, all lung fields clear to auscultation bilaterally  Heart - normal rate and regular rhythm  Neck:  midline trachea, no thyromegaly or nodules  Breasts - breasts appear normal, no suspicious masses, no skin or nipple changes or  axillary nodes  Abdomen - soft, nontender, nondistended, no masses or organomegaly  Pelvic - VULVA: normal appearing vulva with no masses, tenderness or lesions  VAGINA: normal appearing vagina with normal color and discharge, no lesions  CERVIX: normal appearing cervix without discharge or lesions, no CMT, cx stenotic  Thin prep pap is done w/ HR HPV cotesting  UTERUS: uterus is felt to be normal size, shape, consistency and nontender   ADNEXA: No adnexal masses or tenderness noted.  Extremities:  No swelling or varicosities noted  Chaperone: Clint Bolder    Results for orders placed or performed in visit on 08/24/21 (from the past 24 hour(s))  POC Urinalysis Dipstick OB   Collection Time: 08/24/21  3:53 PM  Result Value Ref Range   Color, UA     Clarity, UA     Glucose, UA Negative Negative   Bilirubin, UA     Ketones, UA neg  Spec Grav, UA     Blood, UA small    pH, UA     POC,PROTEIN,UA Negative Negative, Trace, Small (1+), Moderate (2+), Large (3+), 4+   Urobilinogen, UA     Nitrite, UA neg    Leukocytes, UA Negative Negative   Appearance     Odor      Assessment & Plan:  1) Well-Woman Exam  2) Burning/itching w/ urination> dipstick small amt blood, will send cx and do STD screen from pap  3) H/O ASCUS pap> 2014 w/o f/u  4) Elevated bp> normal on repeat  5) Past due for mammogram> order placed, number given to pt to call/schedule  6)  Past due for colonoscopy> GI referral ordered  7) STD screen  Labs/procedures today: pap  Orders Placed This Encounter  Procedures   Urine Culture   MS DIGITAL SCREENING TOMO BILATERAL   Comprehensive metabolic panel   CBC   TSH   Hemoglobin A1c   Ambulatory referral to Gastroenterology   POC Urinalysis Dipstick OB    Meds: No orders of the defined types were placed in this encounter.   Follow-up: Return in about 1 year (around 08/24/2022) for Physical.  Cheral Marker CNM, [facility] 08/24/2021 4:37 PM

## 2021-08-24 NOTE — Patient Instructions (Addendum)
Call [phone] to schedule your mammogram  Primary Care Providers Dr. Dwana Melena Benton) [phone] [facility] [place] [facility] [phone] [facility] [place]) [phone] [facility] [place]) [phone] [facility] [place]) [phone] [facility] [place] [phone] [facility] [phone]

## 2021-08-25 ENCOUNTER — Encounter: Payer: Self-pay | Admitting: Women's Health

## 2021-08-25 ENCOUNTER — Other Ambulatory Visit: Payer: Self-pay | Admitting: Women's Health

## 2021-08-25 ENCOUNTER — Encounter: Payer: Self-pay | Admitting: Internal Medicine

## 2021-08-25 DIAGNOSIS — E039 Hypothyroidism, unspecified: Secondary | ICD-10-CM | POA: Insufficient documentation

## 2021-08-25 LAB — CBC
Hematocrit: 39.1 % (ref 34.0–46.6)
Hemoglobin: 13.1 g/dL (ref 11.1–15.9)
MCH: 31.1 pg (ref 26.6–33.0)
MCHC: 33.5 g/dL (ref 31.5–35.7)
MCV: 93 fL (ref 79–97)
Platelets: 362 10*3/uL (ref 150–450)
RBC: 4.21 x10E6/uL (ref 3.77–5.28)
RDW: 11.5 % — ABNORMAL LOW (ref 11.7–15.4)
WBC: 8.1 10*3/uL (ref 3.4–10.8)

## 2021-08-25 LAB — COMPREHENSIVE METABOLIC PANEL
ALT: 16 IU/L (ref 0–32)
AST: 22 IU/L (ref 0–40)
Albumin/Globulin Ratio: 1.5 (ref 1.2–2.2)
Albumin: 4.3 g/dL (ref 3.8–4.8)
Alkaline Phosphatase: 65 IU/L (ref 44–121)
BUN/Creatinine Ratio: 13 (ref 9–23)
BUN: 9 mg/dL (ref 6–24)
Bilirubin Total: 0.3 mg/dL (ref 0.0–1.2)
CO2: 23 mmol/L (ref 20–29)
Calcium: 9.2 mg/dL (ref 8.7–10.2)
Chloride: 103 mmol/L (ref 96–106)
Creatinine, Ser: 0.7 mg/dL (ref 0.57–1.00)
Globulin, Total: 2.8 g/dL (ref 1.5–4.5)
Glucose: 85 mg/dL (ref 70–99)
Potassium: 4.1 mmol/L (ref 3.5–5.2)
Sodium: 139 mmol/L (ref 134–144)
Total Protein: 7.1 g/dL (ref 6.0–8.5)
eGFR: 107 mL/min/{1.73_m2} (ref >59)

## 2021-08-25 LAB — HEMOGLOBIN A1C
Est. average glucose Bld gHb Est-mCnc: 117 mg/dL
Hgb A1c MFr Bld: 5.7 % — ABNORMAL HIGH (ref 4.8–5.6)

## 2021-08-25 LAB — TSH: TSH: 7.74 u[IU]/mL — ABNORMAL HIGH (ref 0.450–4.500)

## 2021-08-25 MED ORDER — LEVOTHYROXINE SODIUM 25 MCG PO TABS: 25.0000 ug | ORAL_TABLET | Freq: Every day | ORAL | 6 refills | Status: DC

## 2021-08-25 NOTE — Addendum Note (Signed)
Addended by: Shawna Clamp R on: 08/25/2021 10:08 AM   Modules accepted: Orders

## 2021-08-28 ENCOUNTER — Encounter: Payer: Self-pay | Admitting: Women's Health

## 2021-08-28 DIAGNOSIS — R87619 Unspecified abnormal cytological findings in specimens from cervix uteri: Secondary | ICD-10-CM | POA: Insufficient documentation

## 2021-08-28 LAB — CYTOLOGY - PAP
Chlamydia: NEGATIVE
Comment: NEGATIVE
Comment: NEGATIVE
Comment: NEGATIVE
Comment: NORMAL
Diagnosis: UNDETERMINED — AB
HPV 16: NEGATIVE
HPV 18 / 45: NEGATIVE
High risk HPV: POSITIVE — AB
Neisseria Gonorrhea: NEGATIVE

## 2021-08-28 LAB — URINE CULTURE: Organism ID, Bacteria: NO GROWTH

## 2021-09-23 ENCOUNTER — Encounter: Payer: BC Managed Care – PPO | Admitting: Women's Health

## 2021-09-28 ENCOUNTER — Other Ambulatory Visit (HOSPITAL_COMMUNITY)
Admission: RE | Admit: 2021-09-28 | Discharge: 2021-09-28 | Disposition: A | Discharge status: Home / Self Care | Payer: BC Managed Care – PPO | Source: Ambulatory Visit | Attending: Women's Health | Admitting: Women's Health

## 2021-09-28 ENCOUNTER — Other Ambulatory Visit: Payer: Self-pay

## 2021-09-28 ENCOUNTER — Ambulatory Visit (INDEPENDENT_AMBULATORY_CARE_PROVIDER_SITE_OTHER): Payer: BC Managed Care – PPO | Admitting: Women's Health

## 2021-09-28 ENCOUNTER — Encounter: Payer: Self-pay | Admitting: Women's Health

## 2021-09-28 VITALS — BP 135/82 | HR 80 | Ht 60.0 in | Wt 205.0 lb

## 2021-09-28 DIAGNOSIS — R87610 Atypical squamous cells of undetermined significance on cytologic smear of cervix (ASC-US): Secondary | ICD-10-CM

## 2021-09-28 DIAGNOSIS — Z3202 Encounter for pregnancy test, result negative: Secondary | ICD-10-CM

## 2021-09-28 LAB — POCT URINE PREGNANCY: Preg Test, Ur: NEGATIVE

## 2021-09-28 NOTE — Addendum Note (Signed)
Addended by: Janece Canterbury on: 09/28/2021 09:17 AM   Modules accepted: Orders

## 2021-09-28 NOTE — Patient Instructions (Signed)
Colposcopy, Care After ?The following information offers guidance on how to care for yourself after your procedure. Your health care provider may also give you more specific instructions. If you have problems or questions, contact your health care provider. ?What can I expect after the procedure? ?If you had a colposcopy without a biopsy, you can expect to feel fine right away after your procedure. However, you may have some spotting of blood for a few days. You can return to your normal activities. ?If you had a colposcopy with a biopsy, it is common after the procedure to have: ?Soreness and mild pain. These may last for a few days. ?Mild vaginal bleeding or discharge that is dark-colored and grainy. This may last for a few days. The discharge may be caused by a liquid (solution) that was used during the procedure. You may need to wear a sanitary pad during this time. ?Spotting of blood for at least 48 hours after the procedure. ?Follow these instructions at home: ?Medicines ?Take over-the-counter and prescription medicines only as told by your health care provider. ?Talk with your health care provider about what type of over-the-counter pain medicines and prescription medicines you can start to take again. It is especially important to talk with your health care provider if you take blood thinners. ?Activity ?Avoid using douche products, using tampons, and having sex for at least 3 days after the procedure or for as long as told by your health care provider. ?Return to your normal activities as told by your health care provider. Ask your health care provider what activities are safe for you. ?General instructions ?Ask your health care provider if you may take baths, swim, or use a hot tub. You may take showers. ?If you use birth control (contraception), continue to use it. ?Keep all follow-up visits. This is important. ?Contact a health care provider if: ?You have a fever or chills. ?You faint or feel  light-headed. ?Get help right away if: ?You have heavy bleeding from your vagina or pass blood clots. Heavy bleeding is bleeding that soaks through a sanitary pad in less than 1 hour. ?You have vaginal discharge that is abnormal, is yellow in color, or smells bad. This could be a sign of infection. ?You have severe pain or cramps in your lower abdomen that do not go away with medicine. ?Summary ?If you had a colposcopy without a biopsy, you can expect to feel fine right away, but you may have some spotting of blood for a few days. You can return to your normal activities. ?If you had a colposcopy with a biopsy, it is common to have mild pain for a few days and spotting for 48 hours after the procedure. ?Avoid using douche products, using tampons, and having sex for at least 3 days after the procedure or for as long as told by your health care provider. ?Get help right away if you have heavy bleeding, severe pain, or signs of infection. ?This information is not intended to replace advice given to you by your health care provider. Make sure you discuss any questions you have with your health care provider. ?Document Revised: 01/18/2021 Document Reviewed: 01/18/2021 ?Elsevier Patient Education ? 2022 Elsevier Inc. ? ?

## 2021-09-28 NOTE — Progress Notes (Signed)
° °  COLPOSCOPY PROCEDURE NOTE Patient name: Lauren Mata MRN [id number]  Date of birth: [birth year] Subjective Findings:   Lauren Mata is a 51 y.o. [phone] African American female being seen today for a colposcopy. Indication: Abnormal pap on 08/24/21: ASCUS w/ HRHPV positive: other (not 16, 18/45)  Prior cytology:  Date Result Procedure  2014 ASCUS w/ HRHPV not done none              Patient's last menstrual period was 09/12/2021. Contraception: IUD. Menopausal: no. Hysterectomy: no.   Smoker: no. Immunocompromised: no.   The risks and benefits were explained and informed consent was obtained, and written copy is in chart. Pertinent History Reviewed:   Reviewed past medical,surgical, social, obstetrical and family history.  Reviewed problem list, medications and allergies. Objective Findings & Procedure:   Vitals:   09/28/21 0840  BP: 135/82  Pulse: 80  Weight: 205 lb (93 kg)  Height: 5' (1.524 m)  Body mass index is 40.04 kg/m.  Results for orders placed or performed in visit on 09/28/21 (from the past 24 hour(s))  POCT urine pregnancy   Collection Time: 09/28/21  8:44 AM  Result Value Ref Range   Preg Test, Ur Negative Negative     Time out was performed.  Speculum placed in the vagina, cervix fully visualized. SCJ: not fully visualized. Cervix swabbed x 3 with acetic acid.  Acetowhitening present: Yes Cervix: no visible lesions, no mosaicism, no punctation, no abnormal vasculature, and light acetowhite lesion(s) noted at 12 o'clock. Endocervical curettage performed and Cervical biopsies taken at 12 o'clock. Hemostasis achieved w/ Monsels Vagina: vaginal colposcopy not performed Vulva: vulvar colposcopy not performed  Specimens: 2  Complications: none  Chaperone: Faith Rogue    Colposcopic Impression & Plan:   Colposcopy findings consistent with LSIL Plan: Post biopsy instructions given, Will notify patient of results when back, and Will base plan of  care on pathology results and ASCCP guidelines  Return in about 1 year (around 09/28/2022) for Physical.  Cheral Marker CNM, WHNP-BC 09/28/2021 9:05 AM

## 2021-09-29 LAB — SURGICAL PATHOLOGY

## 2021-10-15 ENCOUNTER — Ambulatory Visit: Payer: BC Managed Care – PPO

## 2021-12-14 ENCOUNTER — Ambulatory Visit: Payer: BC Managed Care – PPO

## 2021-12-28 ENCOUNTER — Ambulatory Visit: Payer: BC Managed Care – PPO

## 2022-01-04 ENCOUNTER — Ambulatory Visit (INDEPENDENT_AMBULATORY_CARE_PROVIDER_SITE_OTHER): Payer: BC Managed Care – PPO | Admitting: *Deleted

## 2022-01-04 VITALS — Ht 60.0 in | Wt 196.6 lb

## 2022-01-04 DIAGNOSIS — Z1211 Encounter for screening for malignant neoplasm of colon: Secondary | ICD-10-CM

## 2022-01-04 NOTE — Progress Notes (Addendum)
Gastroenterology Pre-Procedure Review ? ?Request Date: 01/04/2022 ?Requesting Physician: Shawna Clamp, CNM @ [facility], no previous TCS ? ?PATIENT REVIEW QUESTIONS: The patient responded to the following health history questions as indicated:   ? ?1. Diabetes Melitis: no ?2. Joint replacements in the past 12 months: no ?3. Major health problems in the past 3 months: no ?4. Has an artificial valve or MVP: no ?5. Has a defibrillator: no ?6. Has been advised in past to take antibiotics in advance of a procedure like teeth cleaning: no ?7. Family history of colon cancer: no  ?8. Alcohol Use: yes, glass of wine once a month ?9. Illicit drug Use: no ?10. History of sleep apnea: no  ?11. History of coronary artery or other vascular stents placed within the last 12 months: no ?12. History of any prior anesthesia complications: no ?13. Body mass index is 38.4 kg/m?. ?   ?MEDICATIONS & ALLERGIES:    ?Patient reports the following regarding taking any blood thinners:   ?Plavix?  no ?Aspirin?  no ?Coumadin? no ?Brilinta? no ?Xarelto? no ?Eliquis? no ?Pradaxa? no ?Savaysa? no ?Effient? no ? ?Patient confirms/reports the following medications:  ?Current Outpatient Medications  ?Medication Sig Dispense Refill  ? ibuprofen (ADVIL) 800 MG tablet Take 800 mg by mouth as needed.    ? levonorgestrel (MIRENA) 20 MCG/24HR IUD 1 each by Intrauterine route once.    ? levothyroxine (SYNTHROID) 25 MCG tablet Take 1 tablet (25 mcg total) by mouth daily before breakfast. 30 tablet 6  ? ?No current facility-administered medications for this visit.  ? ? ?Patient confirms/reports the following allergies:  ?No Known Allergies ? ?No orders of the defined types were placed in this encounter. ? ? ?AUTHORIZATION INFORMATION ?Primary Insurance: [place],  [place] #: [id number],  Group #: [id number] ?Pre-Cert / Berkley Harvey required: No, not required per Josie ?Pre-Cert / Auth #: Ref#: [id number] ? ?SCHEDULE INFORMATION: ?Procedure has been scheduled as  follows:  ?Date: 02/22/2022, Time: 10:30 ?Location: [facility] with Dr. Jena Gauss ? ?This Gastroenterology Pre-Precedure Review Form is being routed to the following provider(s): Ermalinda Memos, PA-C ?  ?

## 2022-01-04 NOTE — Progress Notes (Signed)
Okay to schedule.  ASA 2. ? ?Needs urine pregnancy test at preop. ?

## 2022-01-05 NOTE — Progress Notes (Signed)
Spoke to pt.  She requested procedure for a Monday.  Offered 01/18/2022 but pt declined due to dentist appointment.  Dr. Gala Romney isn't at hospital the rest of the Mondays in May.  Pt said she will just schedule for June.  Informed her that I would call her once June procedure schedule is available. ?

## 2022-01-15 ENCOUNTER — Other Ambulatory Visit: Payer: Self-pay | Admitting: *Deleted

## 2022-01-15 ENCOUNTER — Encounter: Payer: Self-pay | Admitting: *Deleted

## 2022-01-15 DIAGNOSIS — Z1211 Encounter for screening for malignant neoplasm of colon: Secondary | ICD-10-CM

## 2022-01-15 MED ORDER — NA SULFATE-K SULFATE-MG SULF 17.5-3.13-1.6 GM/177ML PO SOLN: 1.0000 | Freq: Once | ORAL | 0 refills | Status: AC

## 2022-01-15 NOTE — Progress Notes (Signed)
Spoke to [employer] at [employer].  She informed me that colonoscopies are covered starting at age 49.  REF#: [id number] ?

## 2022-01-15 NOTE — Addendum Note (Signed)
Addended by: Noreene Larsson on: 01/15/2022 10:40 AM ? ? Modules accepted: Orders ? ?

## 2022-01-15 NOTE — Progress Notes (Signed)
01/15/2022-Spoke to pt.  She scheduled procedure for 02/22/2022 at 10:30, arrival 9:00 at [facility].  Reviewed prep instructions with pt by phone.  Pt informed to have pregnancy test on 02/19/2022 at [facility].  She is aware that I sent RX for prep kit to pharmacy.  Confirmed mailing address and mailed instructions. ?

## 2022-02-15 ENCOUNTER — Encounter: Payer: Self-pay | Admitting: Women's Health

## 2022-02-21 ENCOUNTER — Encounter (HOSPITAL_COMMUNITY): Payer: Self-pay | Admitting: Anesthesiology

## 2022-02-22 ENCOUNTER — Ambulatory Visit (HOSPITAL_COMMUNITY)
Admission: RE | Admit: 2022-02-22 | Payer: BC Managed Care – PPO | Source: Ambulatory Visit | Admitting: Internal Medicine

## 2022-02-22 ENCOUNTER — Encounter (HOSPITAL_COMMUNITY): Payer: Self-pay | Admitting: Internal Medicine

## 2022-02-22 ENCOUNTER — Other Ambulatory Visit: Payer: Self-pay | Admitting: Women's Health

## 2022-02-22 ENCOUNTER — Telehealth: Payer: Self-pay | Admitting: *Deleted

## 2022-02-22 ENCOUNTER — Other Ambulatory Visit: Payer: BC Managed Care – PPO

## 2022-02-22 ENCOUNTER — Encounter (HOSPITAL_COMMUNITY): Admission: RE | Payer: Self-pay | Source: Ambulatory Visit

## 2022-02-22 DIAGNOSIS — E039 Hypothyroidism, unspecified: Secondary | ICD-10-CM

## 2022-02-22 DIAGNOSIS — Z029 Encounter for administrative examinations, unspecified: Secondary | ICD-10-CM

## 2022-02-22 DIAGNOSIS — Z30433 Encounter for removal and reinsertion of intrauterine contraceptive device: Secondary | ICD-10-CM

## 2022-02-22 DIAGNOSIS — B081 Molluscum contagiosum: Secondary | ICD-10-CM

## 2022-02-22 DIAGNOSIS — R87619 Unspecified abnormal cytological findings in specimens from cervix uteri: Secondary | ICD-10-CM

## 2022-02-22 SURGERY — COLONOSCOPY WITH PROPOFOL
Anesthesia: Monitor Anesthesia Care

## 2022-02-22 MED ORDER — LEVOTHYROXINE SODIUM 25 MCG PO TABS: 25.0000 ug | ORAL_TABLET | Freq: Every day | ORAL | 6 refills | Status: DC

## 2022-02-22 NOTE — Telephone Encounter (Signed)
Received call from endo pt no showed for procedure today. Called patient to reschedule. She stated she wanted to hold off for now before she reschedules. Will call back when she is ready.

## 2022-02-23 LAB — TSH: TSH: 3.45 u[IU]/mL (ref 0.450–4.500)

## 2022-03-04 HISTORY — PX: OTHER SURGICAL HISTORY: SHX169

## 2022-10-09 ENCOUNTER — Other Ambulatory Visit: Payer: Self-pay | Admitting: Women's Health

## 2023-03-14 ENCOUNTER — Encounter: Payer: Self-pay | Admitting: Adult Health

## 2023-03-14 ENCOUNTER — Other Ambulatory Visit (HOSPITAL_COMMUNITY)
Admission: RE | Admit: 2023-03-14 | Discharge: 2023-03-14 | Disposition: A | Discharge status: Home / Self Care | Payer: BC Managed Care – PPO | Source: Ambulatory Visit | Attending: Adult Health | Admitting: Adult Health

## 2023-03-14 ENCOUNTER — Ambulatory Visit: Payer: BC Managed Care – PPO | Admitting: Adult Health

## 2023-03-14 VITALS — BP 134/81 | HR 73 | Ht 60.0 in | Wt 191.0 lb

## 2023-03-14 DIAGNOSIS — Z124 Encounter for screening for malignant neoplasm of cervix: Secondary | ICD-10-CM | POA: Insufficient documentation

## 2023-03-14 DIAGNOSIS — E039 Hypothyroidism, unspecified: Secondary | ICD-10-CM

## 2023-03-14 DIAGNOSIS — R87610 Atypical squamous cells of undetermined significance on cytologic smear of cervix (ASC-US): Secondary | ICD-10-CM

## 2023-03-14 DIAGNOSIS — Z3202 Encounter for pregnancy test, result negative: Secondary | ICD-10-CM | POA: Insufficient documentation

## 2023-03-14 DIAGNOSIS — R300 Dysuria: Secondary | ICD-10-CM

## 2023-03-14 DIAGNOSIS — Z30433 Encounter for removal and reinsertion of intrauterine contraceptive device: Secondary | ICD-10-CM | POA: Diagnosis not present

## 2023-03-14 LAB — POCT URINALYSIS DIPSTICK
Glucose, UA: NEGATIVE
Ketones, UA: NEGATIVE
Leukocytes, UA: NEGATIVE
Nitrite, UA: NEGATIVE
Protein, UA: NEGATIVE

## 2023-03-14 LAB — POCT URINE PREGNANCY: Preg Test, Ur: NEGATIVE

## 2023-03-14 MED ORDER — LEVONORGESTREL 20 MCG/DAY IU IUD
1.0000 | INTRAUTERINE_SYSTEM | Freq: Once | INTRAUTERINE | Status: AC
  Administered 2023-03-14: 1 via INTRAUTERINE

## 2023-03-14 NOTE — Progress Notes (Addendum)
Patient ID: Lauren Mata, female   DOB: 25-Dec-1970, 52 y.o.   MRN: [id number]   IUD INSERTION Patient name: Lauren Mata MRN [id number]  Date of birth: [birth year] Subjective Findings:   Lauren Mata is a 52 y.o. [id number] African American female being seen today for insertion of a Mirena IUD.  Patient's last menstrual period was 02/23/2023 (approximate). Last sexual intercourse was over 6 months ago Last pap 08/24/21. Results were: ASCUS w/ HRHPV positive: other (not 16, 18/45) had colpo 09/28/21 low grade lesion, will repeat now.  The risks and benefits of the method and placement have been thouroughly reviewed with the patient and all questions were answered.  Specifically the patient is aware of failure rate of 09/998, expulsion of the IUD and of possible perforation.  The patient is aware of irregular bleeding due to the method and understands the incidence of irregular bleeding diminishes with time.  Signed copy of informed consent in chart.   Fall risk is low.    08/24/2021    3:16 PM  Depression screen PHQ 2/9  Decreased Interest 0  Down, Depressed, Hopeless 0  PHQ - 2 Score 0  Altered sleeping 0  Tired, decreased energy 2  Change in appetite 0  Feeling bad or failure about yourself  0  Trouble concentrating 0  Moving slowly or fidgety/restless 0  Suicidal thoughts 0  PHQ-9 Score 2        08/24/2021    3:16 PM  GAD 7 : Generalized Anxiety Score  Nervous, Anxious, on Edge 0  Control/stop worrying 0  Worry too much - different things 0  Trouble relaxing 0  Restless 0  Easily annoyed or irritable 0  Afraid - awful might happen 0  Total GAD 7 Score 0    Upstream - 03/14/23 1457       Pregnancy Intention Screening   Does the patient want to become pregnant in the next year? No    Does the patient's partner want to become pregnant in the next year? No    Would the patient like to discuss contraceptive options today? No      Contraception Wrap Up   Current  Method IUD or IUS;Abstinence    End Method IUD or IUS    Contraception Counseling Provided Yes    How was the end contraceptive method provided? Provided on site              Pertinent History Reviewed:   Reviewed past medical,surgical, social, obstetrical and family history.  Reviewed problem list, medications and allergies. Objective Findings & Procedure:   Vitals:   03/14/23 1443  BP: 134/81  Pulse: 73  Weight: 191 lb (86.6 kg)  Height: 5' (1.524 m)  Body mass index is 37.3 kg/m. Urine dipstick was trace blood  No results found for this or any previous visit (from the past 24 hour(s)).   Time out was performed.  A Nhem  speculum was placed in the vagina.  The cervix was visualized, pap with HR HPV genotyping performed, then cervix prepped using Betadine.  The uterus was found to be neutral and it sounded to 7 cm.  Mirena  IUD placed per manufacturer's recommendations. The strings were trimmed to approximately 3 cm. The patient tolerated the procedure well.   Informal transvaginal sonogram was performed and the proper placement of the IUD was verified, by me and [facility].  Chaperone: Malachy Mood   Assessment & Plan:   1) Mirena IUD insertion The  patient was given post procedure instructions, including signs and symptoms of infection and to check for the strings after each menses or each month, and refraining from intercourse or anything in the vagina for 3 days. She was given a care card with date IUD placed, and date IUD to be removed. She is scheduled for a f/u appointment in 4 weeks.  Orders Placed This Encounter  Procedures   POCT urine pregnancy    2. Routine Papanicolaou smear Pap sent  - Cytology - PAP( [place])  3. Burning with urination Urine trace blood Push fluids  - POCT Urinalysis Dipstick  4. Encounter for IUD removal and reinsertion Removed and replaced   5. Hypothyroidism, unspecified type - TSH + free T4  6. Atypical squamous cells of  undetermined significance on cytologic smear of cervix (ASC-US) Pap sent   Follow up in 4 weeks for IUD check   Cyril Mourning  NP 03/14/2023 3:01 PM

## 2023-03-14 NOTE — Addendum Note (Signed)
Addended by: Colen Darling on: 03/14/2023 03:52 PM   Modules accepted: Orders

## 2023-03-15 ENCOUNTER — Other Ambulatory Visit: Payer: Self-pay | Admitting: Adult Health

## 2023-03-15 LAB — TSH+FREE T4
Free T4: 0.75 ng/dL — ABNORMAL LOW (ref 0.82–1.77)
TSH: 4.89 u[IU]/mL — ABNORMAL HIGH (ref 0.450–4.500)

## 2023-03-15 MED ORDER — LEVOTHYROXINE SODIUM 50 MCG PO TABS: 50.0000 ug | ORAL_TABLET | Freq: Every day | ORAL | 3 refills | Status: DC

## 2023-03-15 NOTE — Progress Notes (Signed)
Will increase synthroid to 50 mcg and recheck labs in 3 months

## 2023-03-17 LAB — CYTOLOGY - PAP
Comment: NEGATIVE
Diagnosis: NEGATIVE
Diagnosis: REACTIVE
High risk HPV: NEGATIVE

## 2023-04-18 ENCOUNTER — Ambulatory Visit: Payer: BC Managed Care – PPO | Admitting: Adult Health

## 2023-05-02 ENCOUNTER — Ambulatory Visit: Payer: BC Managed Care – PPO | Admitting: Adult Health

## 2023-05-16 ENCOUNTER — Ambulatory Visit: Payer: BC Managed Care – PPO | Admitting: Adult Health

## 2023-05-23 ENCOUNTER — Encounter: Payer: Self-pay | Admitting: Adult Health

## 2023-05-23 ENCOUNTER — Ambulatory Visit (INDEPENDENT_AMBULATORY_CARE_PROVIDER_SITE_OTHER): Payer: BC Managed Care – PPO | Admitting: Adult Health

## 2023-05-23 VITALS — BP 132/80 | HR 82 | Ht 60.0 in | Wt 192.0 lb

## 2023-05-23 DIAGNOSIS — Z30431 Encounter for routine checking of intrauterine contraceptive device: Secondary | ICD-10-CM | POA: Insufficient documentation

## 2023-05-23 DIAGNOSIS — N9089 Other specified noninflammatory disorders of vulva and perineum: Secondary | ICD-10-CM | POA: Insufficient documentation

## 2023-05-23 DIAGNOSIS — E039 Hypothyroidism, unspecified: Secondary | ICD-10-CM

## 2023-05-23 DIAGNOSIS — L292 Pruritus vulvae: Secondary | ICD-10-CM | POA: Insufficient documentation

## 2023-05-23 MED ORDER — NYSTATIN-TRIAMCINOLONE 100000-0.1 UNIT/GM-% EX OINT: 1.0000 | TOPICAL_OINTMENT | Freq: Two times a day (BID) | CUTANEOUS | 1 refills | Status: DC

## 2023-05-23 NOTE — Progress Notes (Signed)
Subjective:     Patient ID: Lauren Mata, female   DOB: 08-11-1971, 52 y.o.   MRN: [id number]  HPI Lauren Mata is a 52 year old black female,divorced, [id number], in for IUD check. She has noticed rash that itches right vulva for a month.     Component Value Date/Time   DIAGPAP  03/14/2023 1458    - Negative for Intraepithelial Lesions or Malignancy (NILM)   DIAGPAP - Benign reactive/reparative changes 03/14/2023 1458   DIAGPAP (A) 08/24/2021 1519    - Atypical squamous cells of undetermined significance (ASC-US)   HPVHIGH Negative 03/14/2023 1458   HPVHIGH Positive (A) 08/24/2021 1519   ADEQPAP  03/14/2023 1458    Satisfactory for evaluation; transformation zone component PRESENT.   ADEQPAP  08/24/2021 1519    Satisfactory for evaluation; transformation zone component PRESENT.    Review of Systems rash that itches right vulva for a month now No sex lately Has not felt strings Reviewed past medical,surgical, social and family history. Reviewed medications and allergies.     Objective:   Physical Exam BP 132/80 (BP Location: Left Arm, Patient Position: Sitting, Cuff Size: Large)   Pulse 82   Ht 5' (1.524 m)   Wt 192 lb (87.1 kg)   LMP 04/26/2023 (Approximate)   BMI 37.50 kg/m     Skin warm and dry.Pelvic: external genitalia: has thickened skin and cracks in skin right labia, HSV culture obtained, but skin was dry, vagina:pink,urethra has no lesions or masses noted, cervix:smooth, +IUD strings at os, uterus: normal size, shape and contour, non tender, no masses felt, adnexa: no masses or tenderness noted. Bladder is non tender and no masses felt.  Fall risk is low  Upstream - 05/23/23 1122       Pregnancy Intention Screening   Does the patient want to become pregnant in the next year? No    Does the patient's partner want to become pregnant in the next year? No    Would the patient like to discuss contraceptive options today? No      Contraception Wrap Up   Current Method  IUD or IUS    End Method IUD or IUS    Contraception Counseling Provided Yes            Examination chaperoned by Malachy Mood LPN Burman Foster NP student in for exam.   Assessment:     1. IUD check up Mirena placed 03/14/23 +strings at os   2. Vulvar irritation Has thickened skin with cracks HSV culture sent Will rx mycolog ointment  - Herpes simplex virus culture  3. Vulvar itching HSV culture sent Will rx mycolog ointment bid and recheck in 6 weeks  Meds ordered this encounter  Medications   nystatin-triamcinolone ointment (MYCOLOG)    Sig: Apply 1 Application topically 2 (two) times daily.    Dispense:  30 g    Refill:  1    Order Specific Question:   Supervising Provider    Answer:   Despina Hidden, LUTHER H [[id number]]    - Herpes simplex virus culture  4. Hypothyroidism, unspecified type Check TSH and free T4 before appt - TSH + free T4     Plan:     Follow up in 6 weeks for recheck

## 2023-05-26 LAB — HERPES SIMPLEX VIRUS CULTURE

## 2023-07-04 ENCOUNTER — Ambulatory Visit: Payer: BC Managed Care – PPO | Admitting: Adult Health

## 2023-07-04 ENCOUNTER — Encounter: Payer: Self-pay | Admitting: Adult Health

## 2023-07-04 VITALS — BP 151/85 | HR 68 | Ht 60.0 in | Wt 194.0 lb

## 2023-07-04 DIAGNOSIS — L900 Lichen sclerosus et atrophicus: Secondary | ICD-10-CM

## 2023-07-04 DIAGNOSIS — L292 Pruritus vulvae: Secondary | ICD-10-CM | POA: Diagnosis not present

## 2023-07-04 DIAGNOSIS — R42 Dizziness and giddiness: Secondary | ICD-10-CM

## 2023-07-04 DIAGNOSIS — E039 Hypothyroidism, unspecified: Secondary | ICD-10-CM | POA: Diagnosis not present

## 2023-07-04 DIAGNOSIS — R030 Elevated blood-pressure reading, without diagnosis of hypertension: Secondary | ICD-10-CM

## 2023-07-04 MED ORDER — CLOBETASOL PROPIONATE 0.05 % EX OINT: TOPICAL_OINTMENT | CUTANEOUS | 3 refills | Status: DC

## 2023-07-04 NOTE — Progress Notes (Signed)
  Subjective:     Patient ID: Lauren Mata, female   DOB: Jul 10, 1971, 52 y.o.   MRN: [id number]  HPI Lauren Mata is a 52 year old black female, divorced, [id number] back in follow up on itching right vulva area was prescribed mycology ointment and is better, itches at night. Feels dizzy at times, at work usually around lunch time. Has not gotten labs done for thyroid yet.     Component Value Date/Time   DIAGPAP  03/14/2023 1458    - Negative for Intraepithelial Lesions or Malignancy (NILM)   DIAGPAP - Benign reactive/reparative changes 03/14/2023 1458   DIAGPAP (A) 08/24/2021 1519    - Atypical squamous cells of undetermined significance (ASC-US)   HPVHIGH Negative 03/14/2023 1458   HPVHIGH Positive (A) 08/24/2021 1519   ADEQPAP  03/14/2023 1458    Satisfactory for evaluation; transformation zone component PRESENT.   ADEQPAP  08/24/2021 1519    Satisfactory for evaluation; transformation zone component PRESENT.   Review of Systems Itching right vulva is better, but itches at night +dizzy at times,at work usually around lunch time. No period with IUD Reviewed past medical,surgical, social and family history. Reviewed medications and allergies.     Objective:   Physical Exam BP (!) 151/85 (BP Location: Right Arm, Patient Position: Sitting, Cuff Size: Large)   Pulse 68   Ht 5' (1.524 m)   Wt 194 lb (88 kg)   BMI 37.89 kg/m     Skin warm and dry.Pelvic: external genitalia, has thickened skin right labia, and it is lighter in skin tone.  Upstream - 07/04/23 1024       Pregnancy Intention Screening   Does the patient want to become pregnant in the next year? No    Does the patient's partner want to become pregnant in the next year? No    Would the patient like to discuss contraceptive options today? No      Contraception Wrap Up   Current Method IUD or IUS    End Method IUD or IUS    Contraception Counseling Provided Yes            Examination chaperoned by Lauren Scearce NP  student Assessment:      1. Vulvar itching Has itching esp at night still, but is better   2. Lichen sclerosus +itching + thickened skin right labia, and it is lighter in skin tone. Will rx temovate Meds ordered this encounter  Medications   clobetasol ointment (TEMOVATE) 0.05 %    Sig: Apply 2 x daily to external affected area then 2 x weekly    Dispense:  30 g    Refill:  3    Order Specific Question:   Supervising Provider    Answer:   Lazaro Arms [[id number]]   Will recheck in 9 weeks, may need biopsy if no change   3. Elevated BP without diagnosis of hypertension Decrease salt and sugar Will recheck in 9 weeks   4. Hypothyroidism, unspecified type Get labs done  5. Dizzy spells Dizzy at times at work, near lunch time, try to have protein in am     Plan:     Follow up in 9 weeks for recheck of vulva and BP

## 2023-07-08 ENCOUNTER — Other Ambulatory Visit: Payer: Self-pay | Admitting: Adult Health

## 2023-09-05 ENCOUNTER — Encounter: Payer: Self-pay | Admitting: Adult Health

## 2023-09-05 ENCOUNTER — Ambulatory Visit: Payer: BC Managed Care – PPO | Admitting: Adult Health

## 2023-09-05 VITALS — BP 132/78 | HR 77 | Ht 60.0 in | Wt 190.0 lb

## 2023-09-05 DIAGNOSIS — R42 Dizziness and giddiness: Secondary | ICD-10-CM | POA: Diagnosis not present

## 2023-09-05 DIAGNOSIS — L292 Pruritus vulvae: Secondary | ICD-10-CM

## 2023-09-05 DIAGNOSIS — Z975 Presence of (intrauterine) contraceptive device: Secondary | ICD-10-CM | POA: Diagnosis not present

## 2023-09-05 DIAGNOSIS — L900 Lichen sclerosus et atrophicus: Secondary | ICD-10-CM

## 2023-09-05 NOTE — Progress Notes (Signed)
  Subjective:     Patient ID: Lauren Mata, female   DOB: 1971-09-04, 52 y.o.   MRN: [id number]  HPI Lauren Mata is a 52 year old black female, divorced, [id number] back in for BP check and on vulva itching. Itching has resolved.     Component Value Date/Time   DIAGPAP  03/14/2023 1458    - Negative for Intraepithelial Lesions or Malignancy (NILM)   DIAGPAP - Benign reactive/reparative changes 03/14/2023 1458   DIAGPAP (A) 08/24/2021 1519    - Atypical squamous cells of undetermined significance (ASC-US)   HPVHIGH Negative 03/14/2023 1458   HPVHIGH Positive (A) 08/24/2021 1519   ADEQPAP  03/14/2023 1458    Satisfactory for evaluation; transformation zone component PRESENT.   ADEQPAP  08/24/2021 1519    Satisfactory for evaluation; transformation zone component PRESENT.     Review of Systems Vulva itching has resolved Still has dizzy spells at times Reviewed past medical,surgical, social and family history. Reviewed medications and allergies.     Objective:   Physical Exam BP 132/78 (BP Location: Left Arm, Patient Position: Sitting, Cuff Size: Large)   Pulse 77   Ht 5' (1.524 m)   Wt 190 lb (86.2 kg)   LMP 08/19/2023   BMI 37.11 kg/m     Skin warm and dry.. Lungs: clear to ausculation bilaterally. Cardiovascular: regular rate and rhythm.  Pelvic: external genitalia is normal in appearance no lesions, vagina:pink,urethra has no lesions or masses noted, cervix:smooth and bulbous, +IUD strings at os,uterus: normal size, shape and contour, non tender, no masses felt, adnexa: no masses or tenderness noted. Bladder is non tender and no masses felt.   Upstream - 09/05/23 0916       Pregnancy Intention Screening   Does the patient want to become pregnant in the next year? No    Does the patient's partner want to become pregnant in the next year? No    Would the patient like to discuss contraceptive options today? No      Contraception Wrap Up   Current Method IUD or IUS    End  Method IUD or IUS    Contraception Counseling Provided Yes            Examination chaperoned by Malachy Mood LPN  Assessment:     1. Lichen sclerosus (Primary) Continue temovate 2 x weekly   2. Vulvar itching Has resolved   3. Dizzy spells Dizzy at times, has not gotten thyroid labs done yet  4. IUD (intrauterine device) in place     Plan:     Follow up in 6 months for physical

## 2023-11-01 ENCOUNTER — Other Ambulatory Visit: Payer: Self-pay | Admitting: Adult Health

## 2023-11-01 LAB — TSH+FREE T4
Free T4: 0.9 ng/dL (ref 0.82–1.77)
TSH: 2.53 u[IU]/mL (ref 0.450–4.500)

## 2023-11-01 MED ORDER — LEVOTHYROXINE SODIUM 50 MCG PO TABS: 50.0000 ug | ORAL_TABLET | Freq: Every day | ORAL | 6 refills | Status: DC

## 2024-04-10 ENCOUNTER — Encounter: Payer: Self-pay | Admitting: *Deleted

## 2024-04-17 ENCOUNTER — Other Ambulatory Visit: Payer: Self-pay | Admitting: Adult Health

## 2024-06-03 ENCOUNTER — Other Ambulatory Visit: Payer: Self-pay | Admitting: Adult Health

## 2024-06-04 ENCOUNTER — Other Ambulatory Visit: Payer: Self-pay | Admitting: Adult Health

## 2024-06-04 DIAGNOSIS — E039 Hypothyroidism, unspecified: Secondary | ICD-10-CM

## 2024-06-04 NOTE — Progress Notes (Signed)
Ck TSH and free T4 

## 2024-07-10 ENCOUNTER — Ambulatory Visit: Payer: Self-pay | Admitting: Adult Health

## 2024-07-10 ENCOUNTER — Other Ambulatory Visit: Payer: Self-pay | Admitting: Adult Health

## 2024-07-10 LAB — TSH+FREE T4
Free T4: 1.07 ng/dL (ref 0.82–1.77)
TSH: 1.95 u[IU]/mL (ref 0.450–4.500)

## 2024-07-10 MED ORDER — LEVOTHYROXINE SODIUM 50 MCG PO TABS: 50.0000 ug | ORAL_TABLET | Freq: Every day | ORAL | 6 refills | Status: AC

## 2024-07-10 NOTE — Progress Notes (Signed)
 Refilled synthroid

## 2024-09-05 ENCOUNTER — Other Ambulatory Visit: Payer: Self-pay | Admitting: Adult Health
